# Patient Record
Sex: Female | Born: 1975 | Race: Black or African American | Hispanic: No | Marital: Single | Smoking: Current every day smoker
Health system: Southern US, Community
[De-identification: ages and names within clinical notes are randomized; demographics above are authoritative.]

## PROBLEM LIST (undated history)

## (undated) DIAGNOSIS — G629 Polyneuropathy, unspecified: Secondary | ICD-10-CM

---

## 2000-08-09 ENCOUNTER — Encounter: Admission: RE | Admit: 2000-08-09 | Discharge: 2000-08-09 | Payer: Self-pay | Admitting: Obstetrics

## 2000-08-16 ENCOUNTER — Encounter: Admission: RE | Admit: 2000-08-16 | Discharge: 2000-08-16 | Payer: Self-pay | Admitting: Obstetrics

## 2000-10-18 ENCOUNTER — Encounter: Admission: RE | Admit: 2000-10-18 | Discharge: 2000-10-18 | Payer: Self-pay | Admitting: Obstetrics

## 2000-10-18 ENCOUNTER — Ambulatory Visit (HOSPITAL_COMMUNITY): Admission: RE | Admit: 2000-10-18 | Discharge: 2000-10-18 | Payer: Self-pay | Admitting: Obstetrics

## 2000-11-01 ENCOUNTER — Encounter: Admission: RE | Admit: 2000-11-01 | Discharge: 2000-11-01 | Payer: Self-pay | Admitting: Obstetrics

## 2000-11-21 ENCOUNTER — Encounter: Admission: RE | Admit: 2000-11-21 | Discharge: 2000-11-21 | Payer: Self-pay | Admitting: Obstetrics & Gynecology

## 2000-11-28 ENCOUNTER — Encounter: Admission: RE | Admit: 2000-11-28 | Discharge: 2000-11-28 | Payer: Self-pay | Admitting: Obstetrics & Gynecology

## 2000-12-03 ENCOUNTER — Inpatient Hospital Stay (HOSPITAL_COMMUNITY): Admission: AD | Admit: 2000-12-03 | Discharge: 2000-12-05 | Payer: Self-pay | Admitting: Obstetrics & Gynecology

## 2008-09-02 ENCOUNTER — Ambulatory Visit (HOSPITAL_COMMUNITY): Admission: RE | Admit: 2008-09-02 | Discharge: 2008-09-02 | Payer: Self-pay | Admitting: Obstetrics and Gynecology

## 2008-10-01 ENCOUNTER — Ambulatory Visit (HOSPITAL_COMMUNITY): Admission: RE | Admit: 2008-10-01 | Discharge: 2008-10-01 | Payer: Self-pay | Admitting: Obstetrics and Gynecology

## 2008-10-29 ENCOUNTER — Ambulatory Visit (HOSPITAL_COMMUNITY): Admission: RE | Admit: 2008-10-29 | Discharge: 2008-10-29 | Payer: Self-pay | Admitting: Obstetrics and Gynecology

## 2008-11-26 ENCOUNTER — Encounter: Payer: Self-pay | Admitting: Obstetrics and Gynecology

## 2008-11-26 ENCOUNTER — Observation Stay (HOSPITAL_COMMUNITY): Admission: AD | Admit: 2008-11-26 | Discharge: 2008-11-27 | Payer: Self-pay | Admitting: Obstetrics & Gynecology

## 2008-12-18 ENCOUNTER — Encounter: Payer: Self-pay | Admitting: Obstetrics and Gynecology

## 2008-12-18 ENCOUNTER — Inpatient Hospital Stay (HOSPITAL_COMMUNITY): Admission: AD | Admit: 2008-12-18 | Discharge: 2008-12-18 | Payer: Self-pay | Admitting: Obstetrics and Gynecology

## 2008-12-25 ENCOUNTER — Ambulatory Visit (HOSPITAL_COMMUNITY): Admission: RE | Admit: 2008-12-25 | Discharge: 2008-12-25 | Payer: Self-pay | Admitting: Obstetrics and Gynecology

## 2009-01-08 ENCOUNTER — Encounter: Payer: Self-pay | Admitting: Obstetrics and Gynecology

## 2009-01-08 ENCOUNTER — Inpatient Hospital Stay (HOSPITAL_COMMUNITY): Admission: AD | Admit: 2009-01-08 | Discharge: 2009-01-10 | Payer: Self-pay | Admitting: Obstetrics and Gynecology

## 2009-01-09 ENCOUNTER — Encounter: Payer: Self-pay | Admitting: Obstetrics and Gynecology

## 2009-11-20 IMAGING — US US OB FOLLOW-UP
1 series · 18 of 28 positions shown · non-contrast
Comparison: none

OBSTETRICAL ULTRASOUND:
 This ultrasound was performed in The [HOSPITAL], and the [facility] report will be stored to [REDACTED] PACS.

[Series 1: us ob follow-up · 18 of 46 slices shown]
[im 1/46]
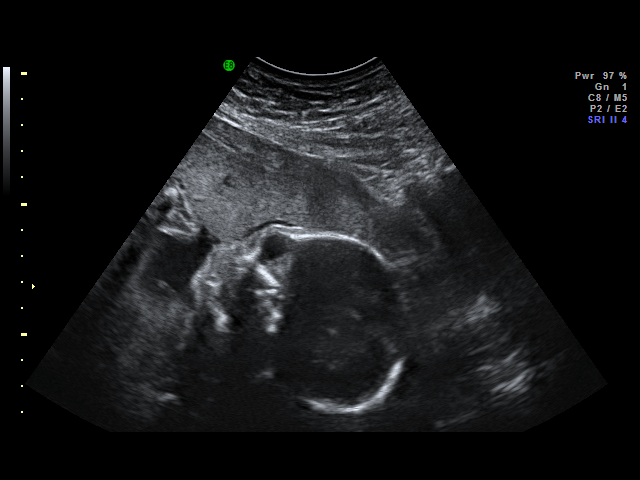
[im 4/46]
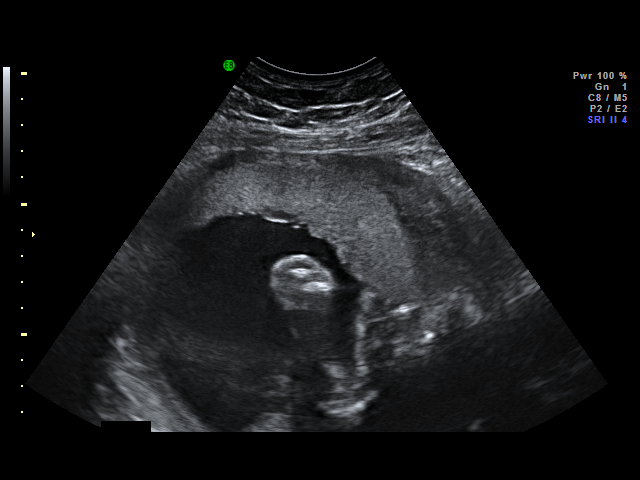
[im 6/46]
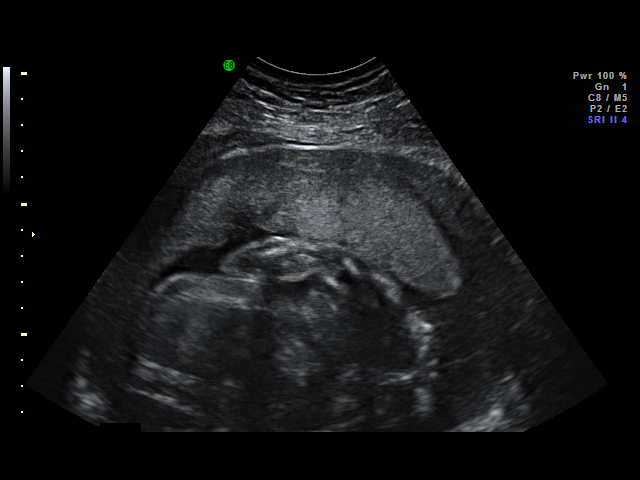
[im 9/46]
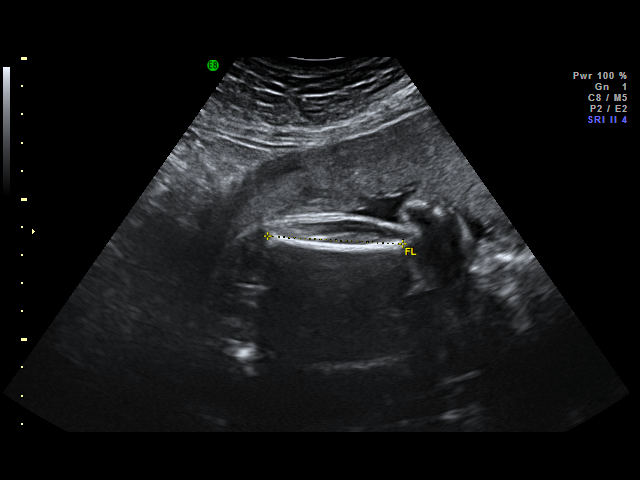
[im 12/46]
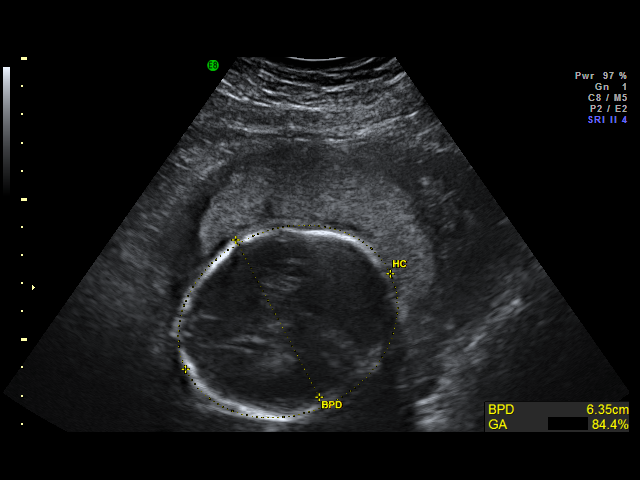
[im 14/46]
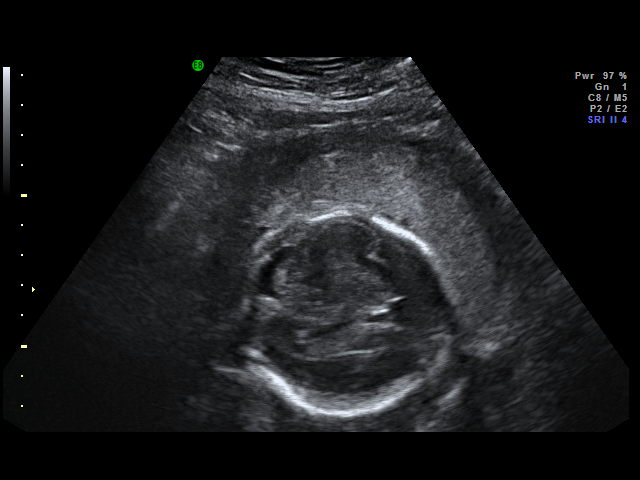
[im 17/46]
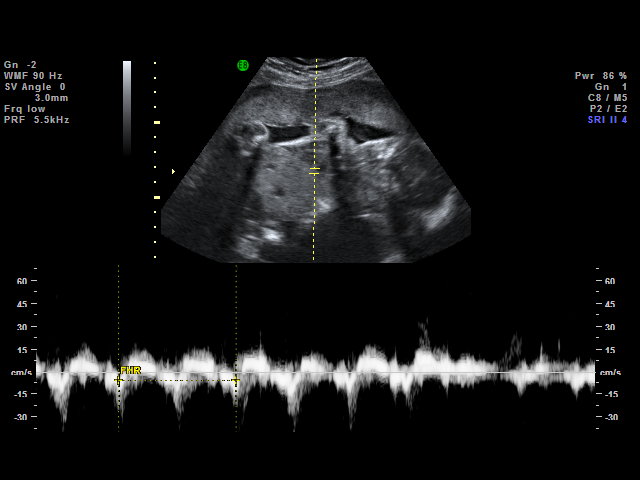
[im 19/46]
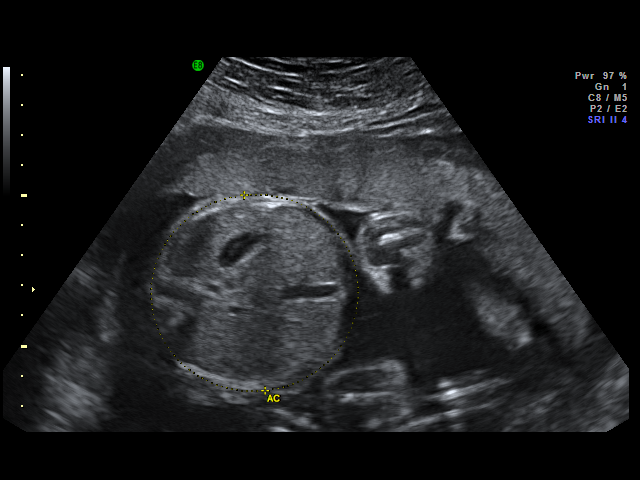
[im 22/46]
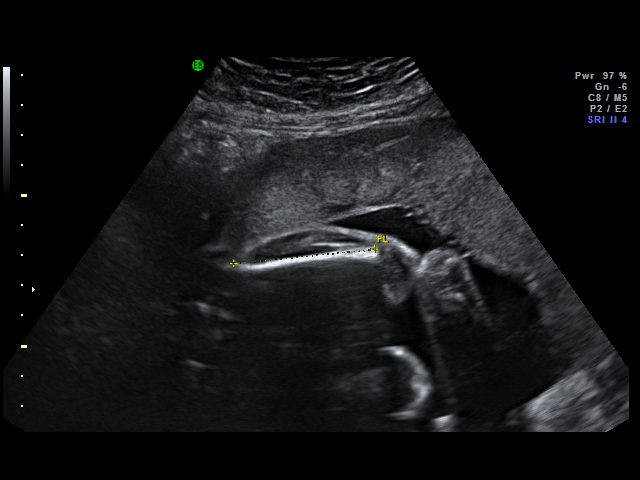
[im 24/46]
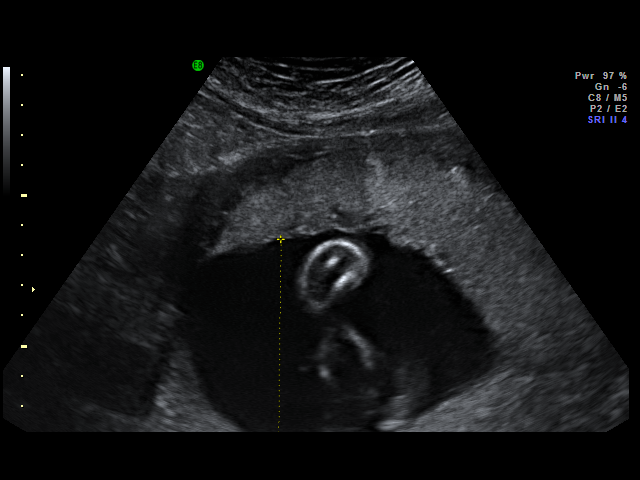
[im 27/46]
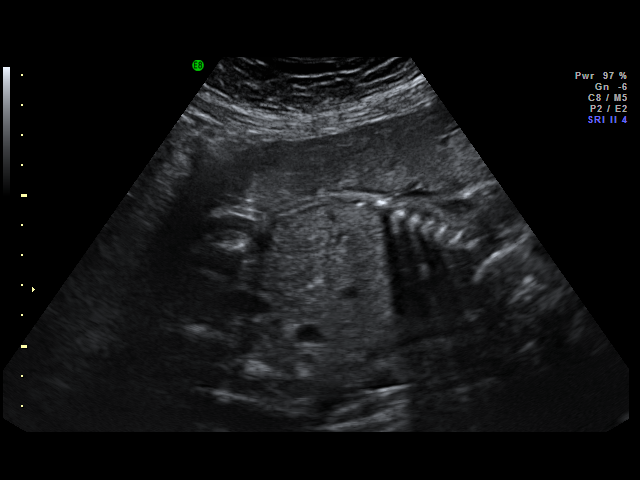
[im 29/46]
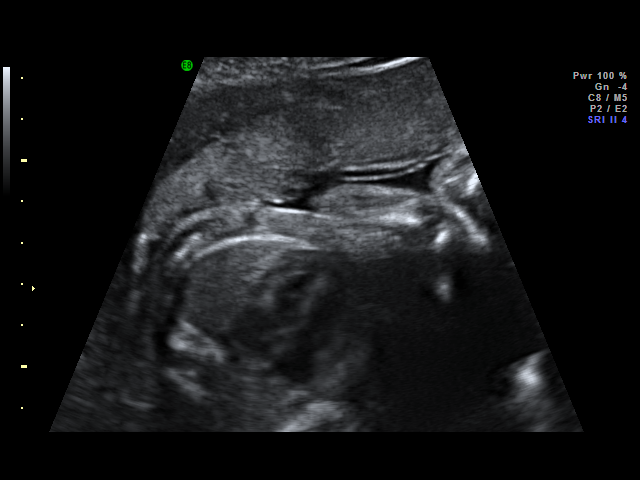
[im 32/46]
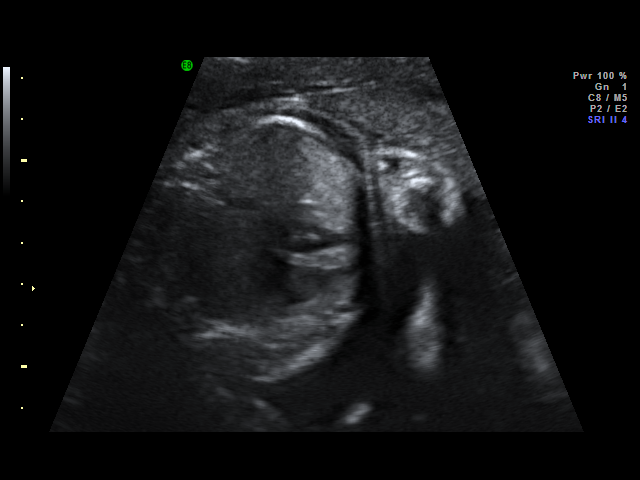
[im 36/46]
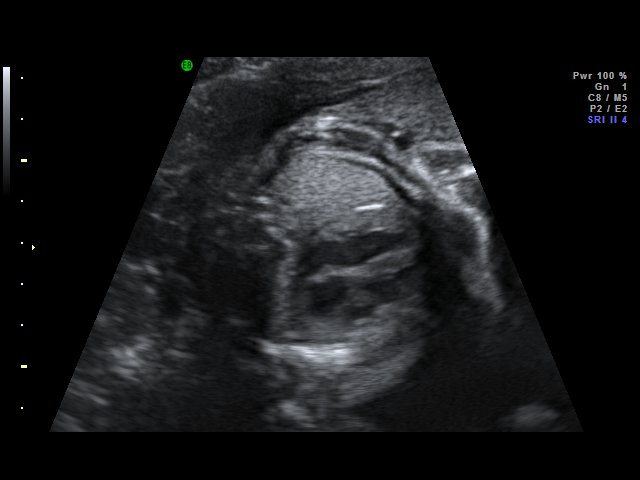
[im 37/46]
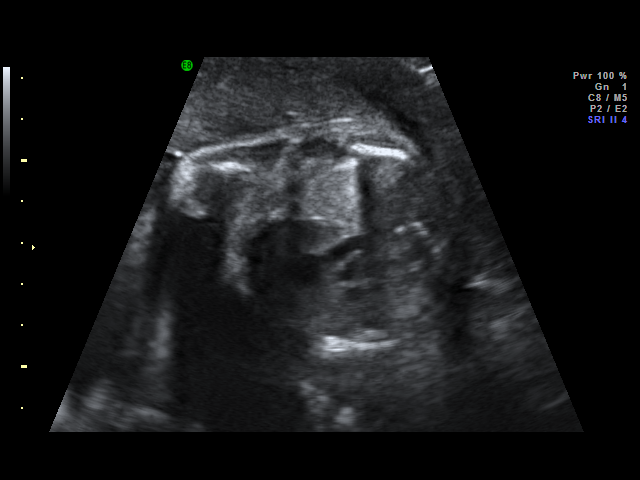
[im 41/46]
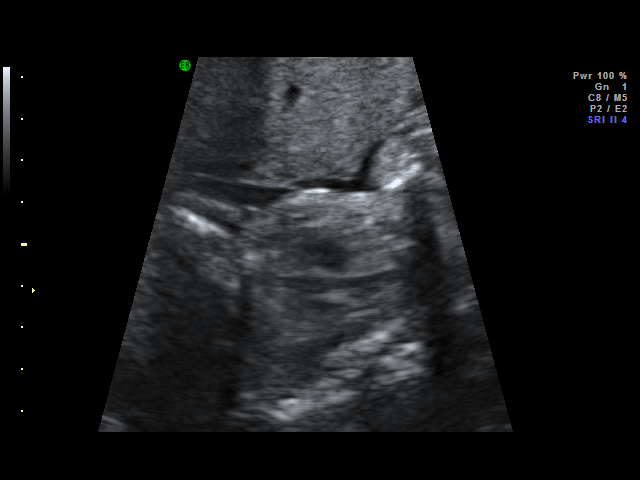
[im 42/46]
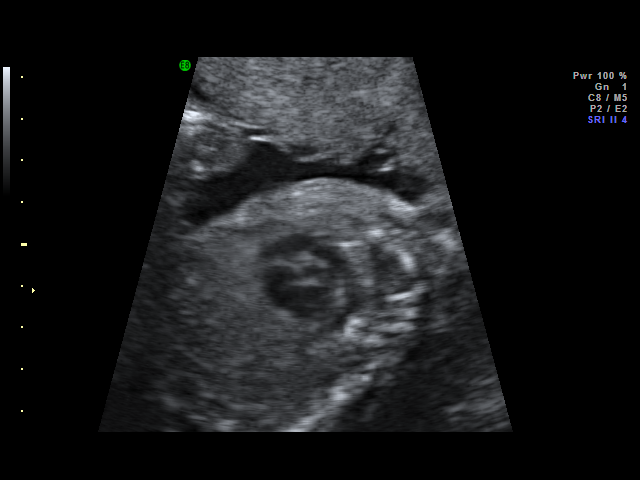
[im 46/46]
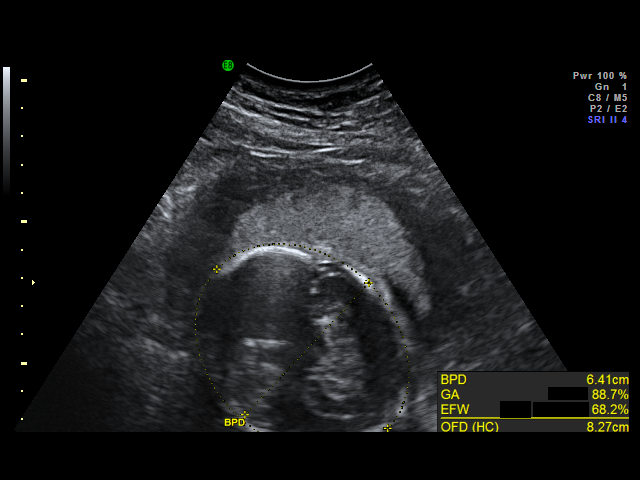

[18 of 28 positions shown; findings below may reference images not displayed]

IMPRESSION: [facility] has also been faxed to the ordering physician.

## 2010-02-06 IMAGING — US US OB FOLLOW-UP
1 series · 18 of 25 positions shown · non-contrast
Comparison: none

OBSTETRICAL ULTRASOUND:
 This ultrasound was performed in The [[facility]OSP[facility]], and the [facility] report will be stored to [REDACTED] PACS.

[Series 1: us ob follow-up · 18 of 25 slices shown]
[im 1/25]
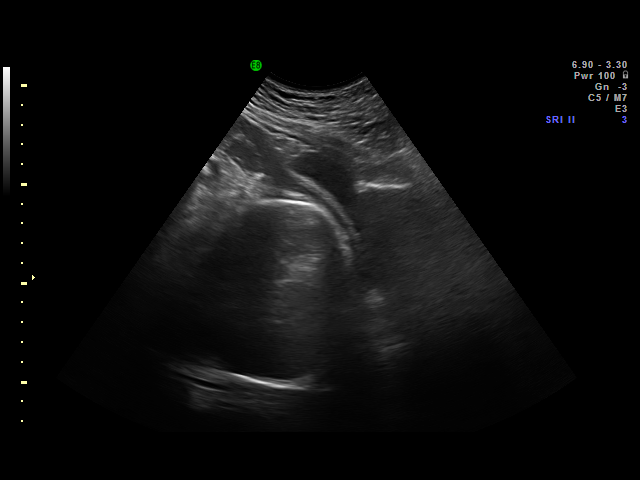
[im 3/25]
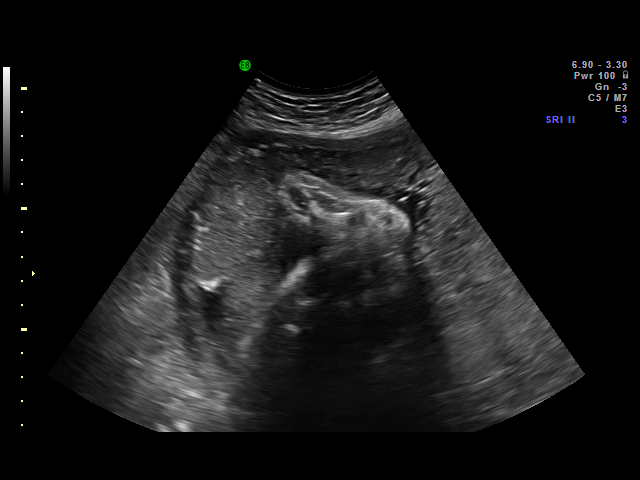
[im 4/25]
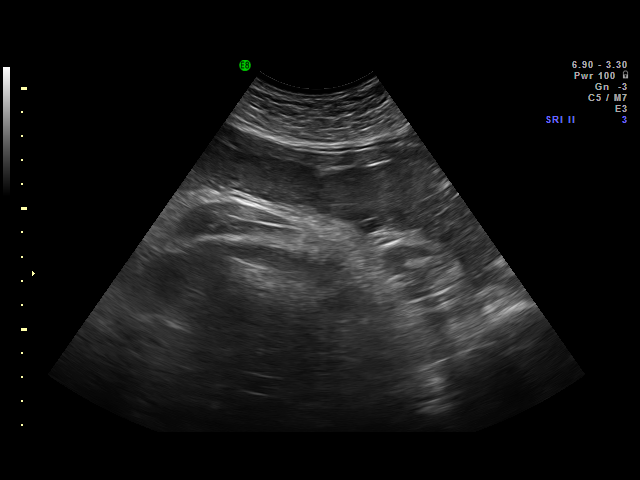
[im 5/25]
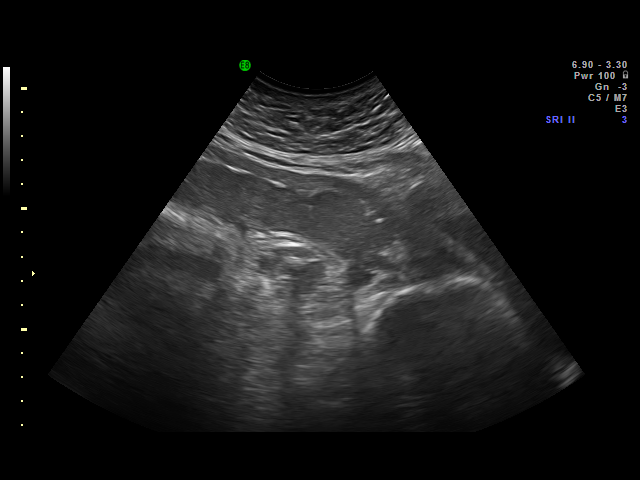
[im 7/25]
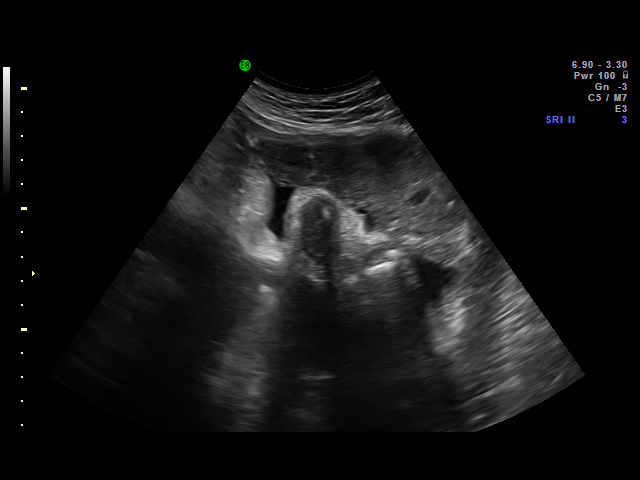
[im 8/25]
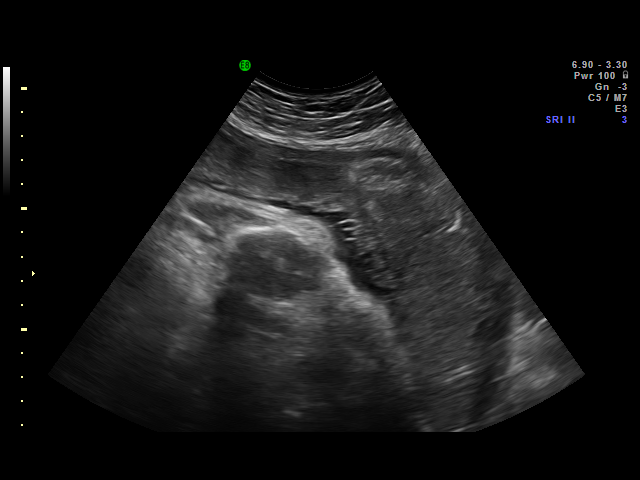
[im 10/25]
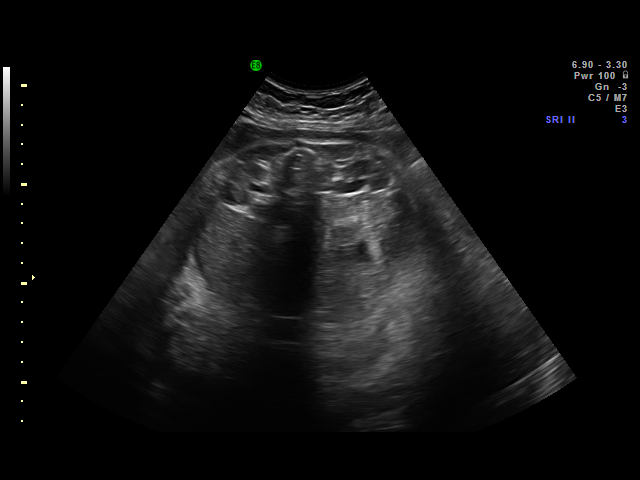
[im 11/25]
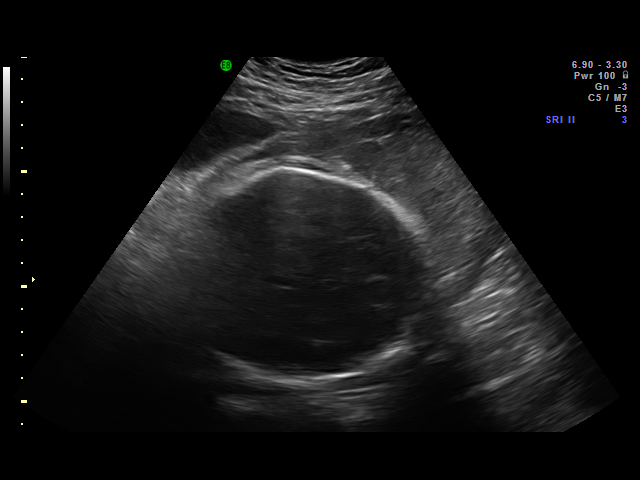
[im 12/25]
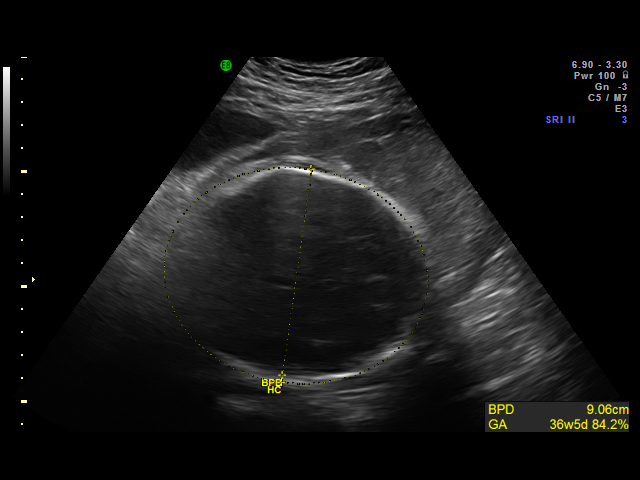
[im 14/25]
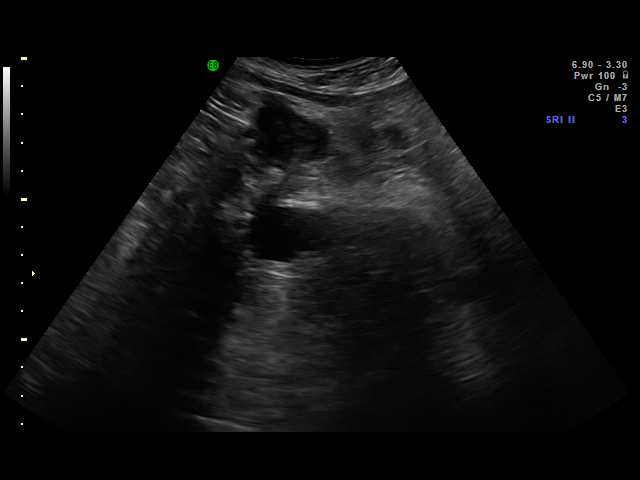
[im 15/25]
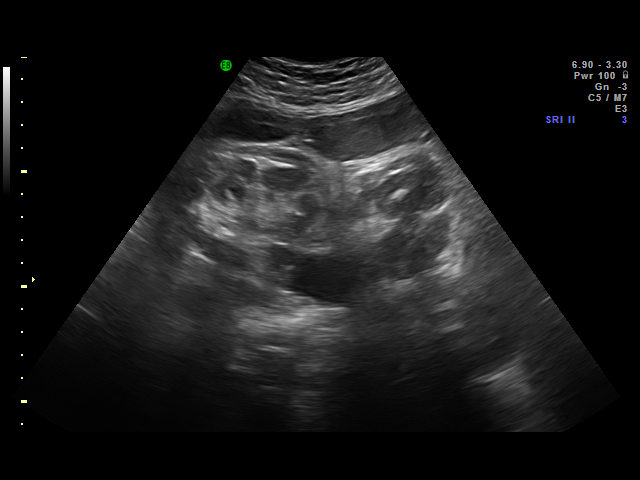
[im 16/25]
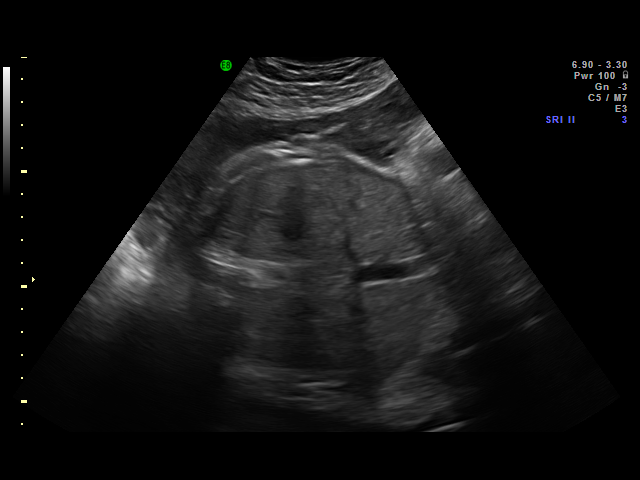
[im 18/25]
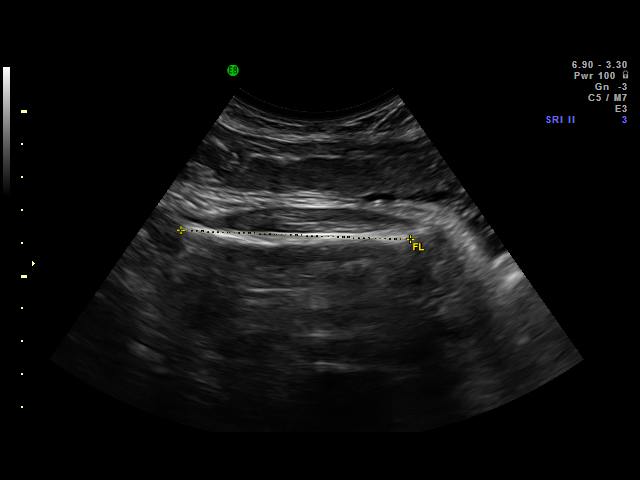
[im 19/25]
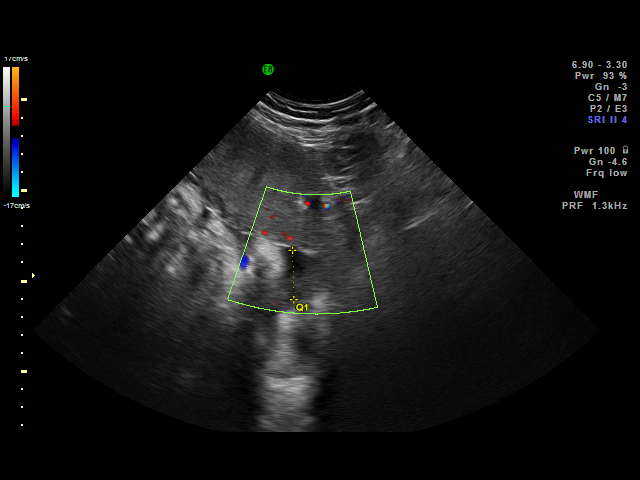
[im 21/25]
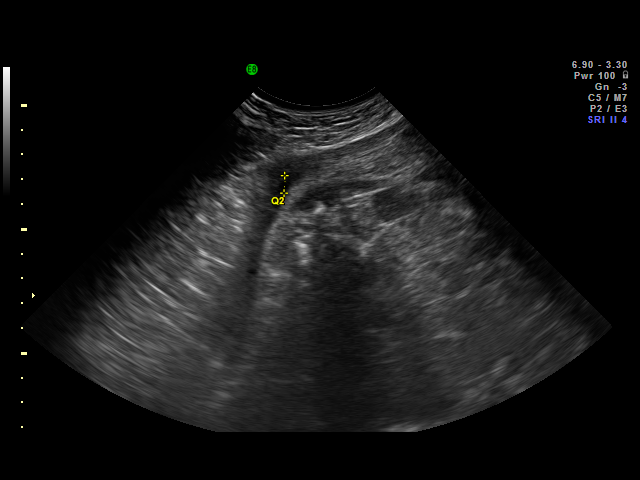
[im 22/25]
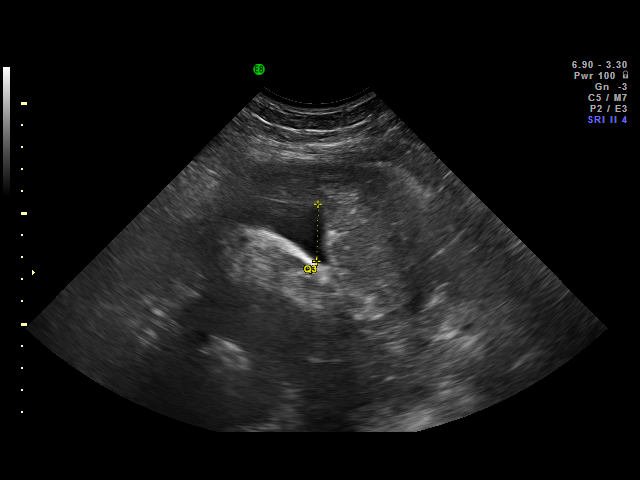
[im 23/25]
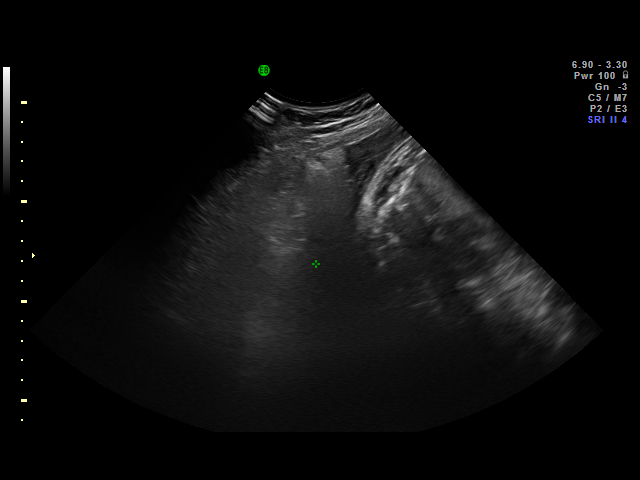
[im 25/25]
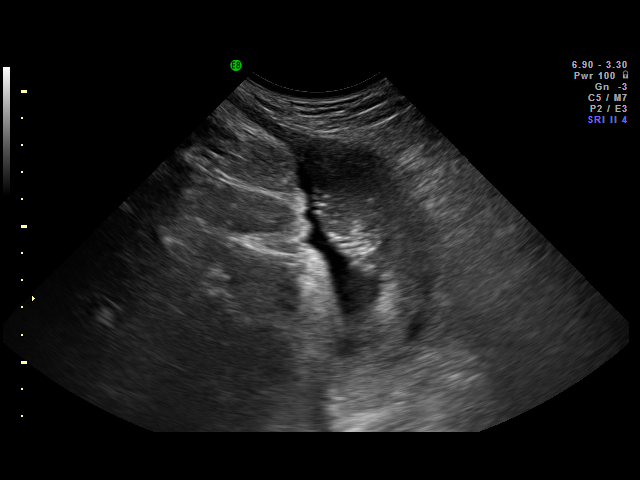

[18 of 25 positions shown; findings below may reference images not displayed]

IMPRESSION: [facility] has also been faxed to the ordering physician.

## 2010-02-13 IMAGING — US US OB LIMITED
1 series · 14 of 15 positions shown · non-contrast
Comparison: none

OBSTETRICAL ULTRASOUND:
 This ultrasound was performed in The [[facility]OS[facility]], and the [facility] report will be stored to [REDACTED] PACS.

[Series 1: us ob limited · 14 of 15 slices shown]
[im 1/15]
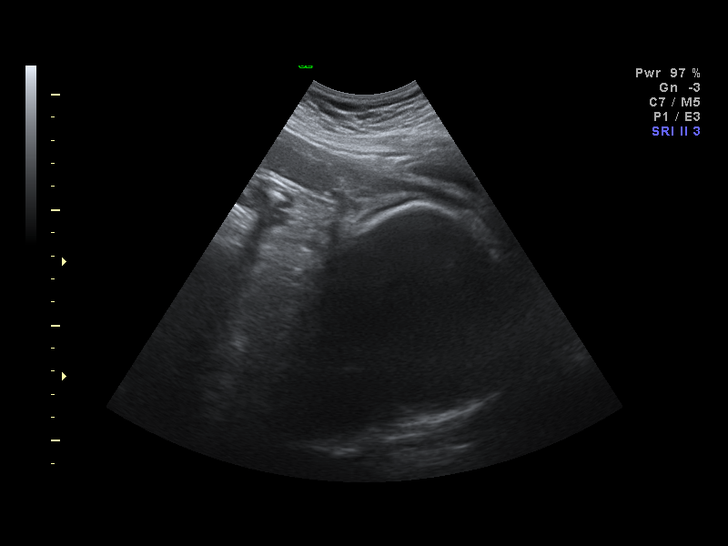
[im 2/15]
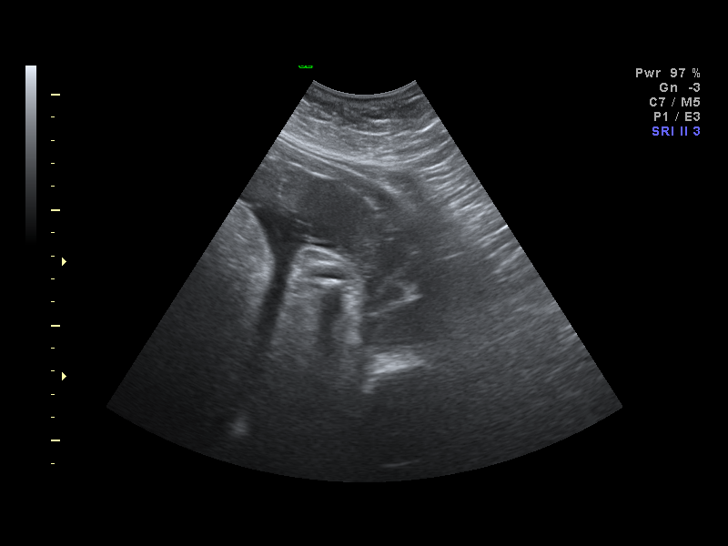
[im 3/15]
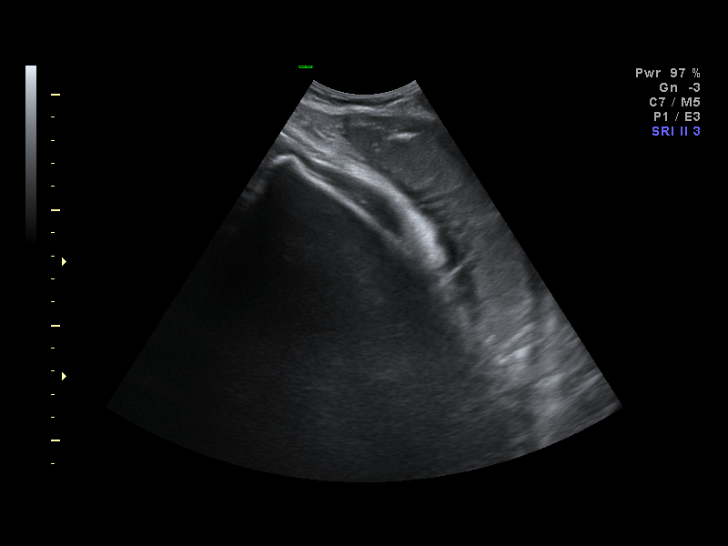
[im 4/15]
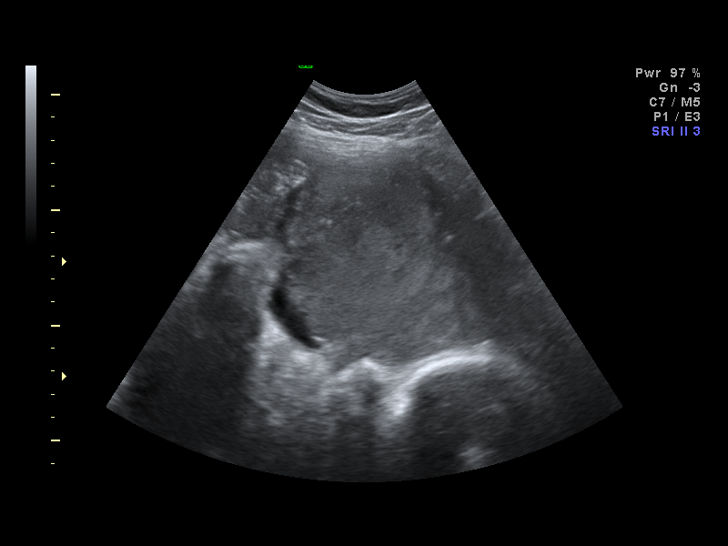
[im 5/15]
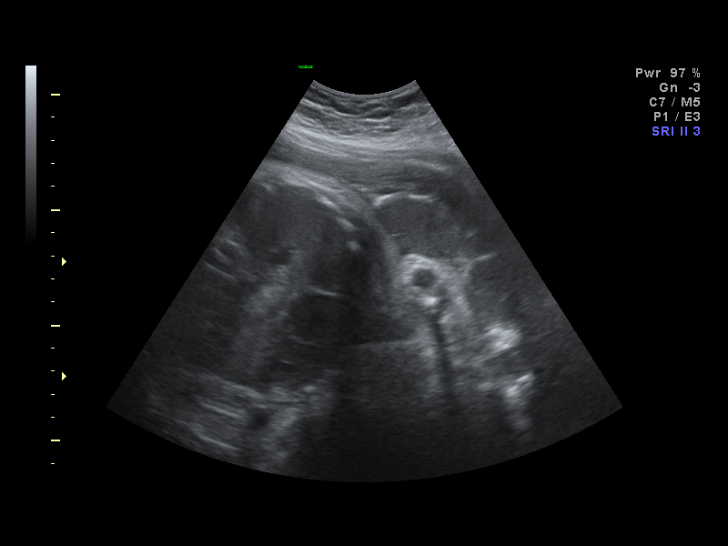
[im 6/15]
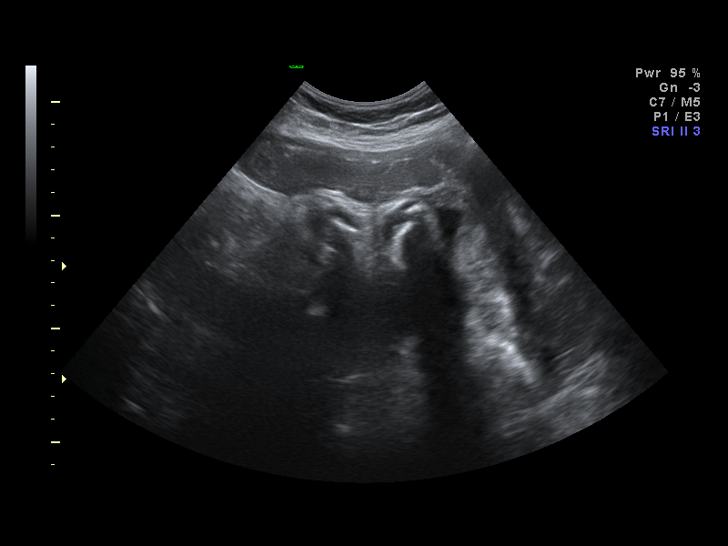
[im 7/15]
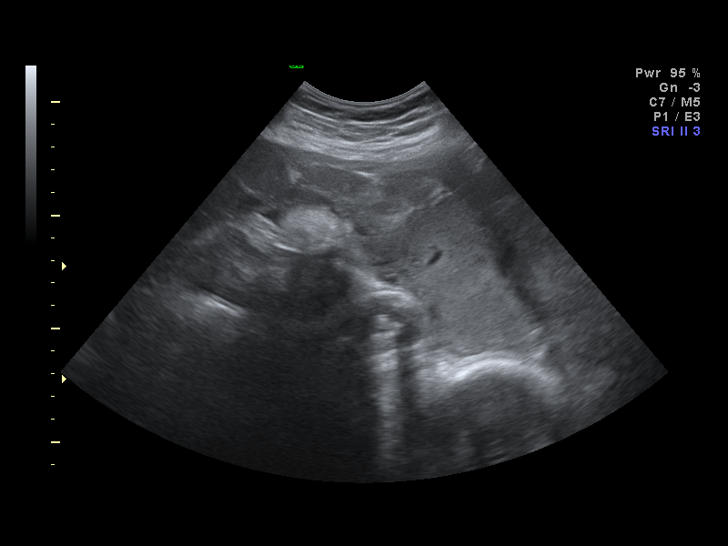
[im 9/15]
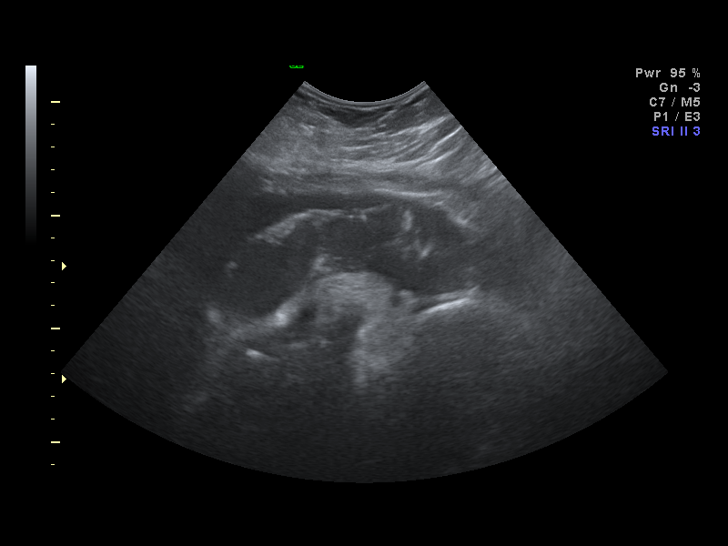
[im 10/15]
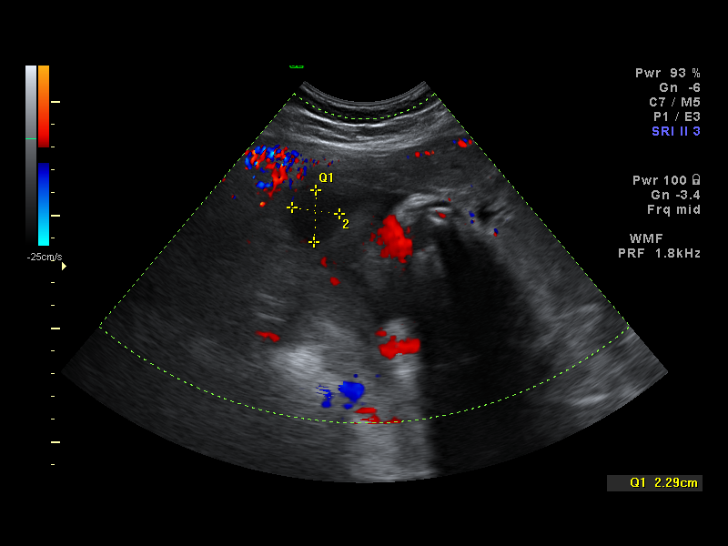
[im 11/15]
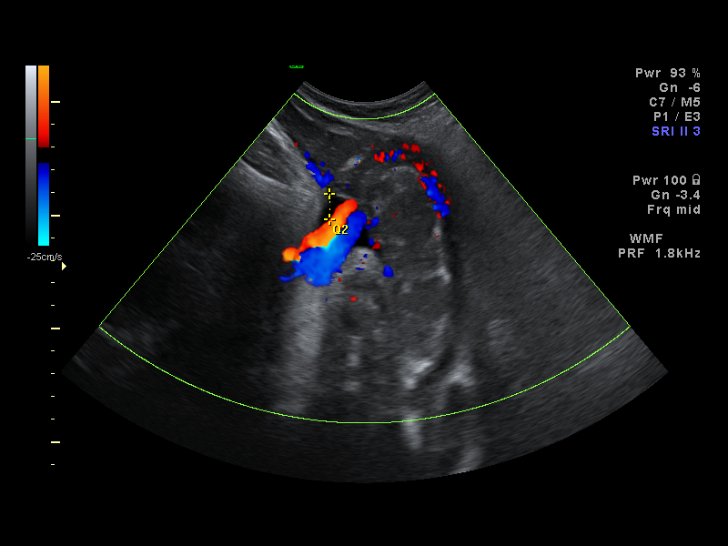
[im 12/15]
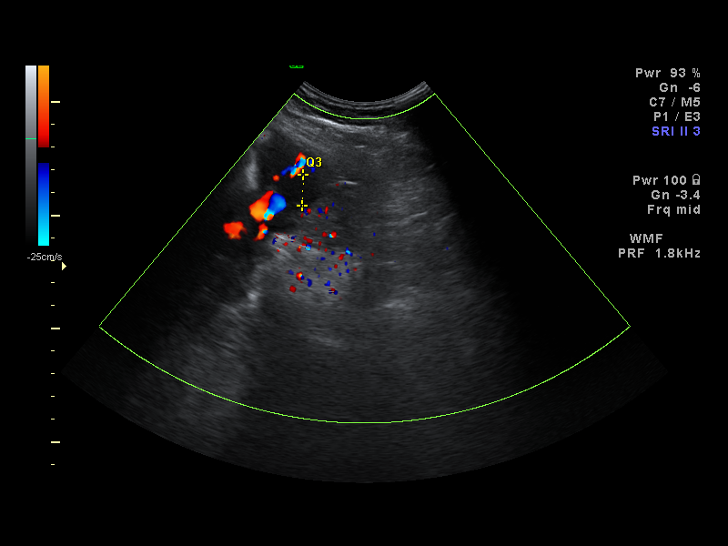
[im 13/15]
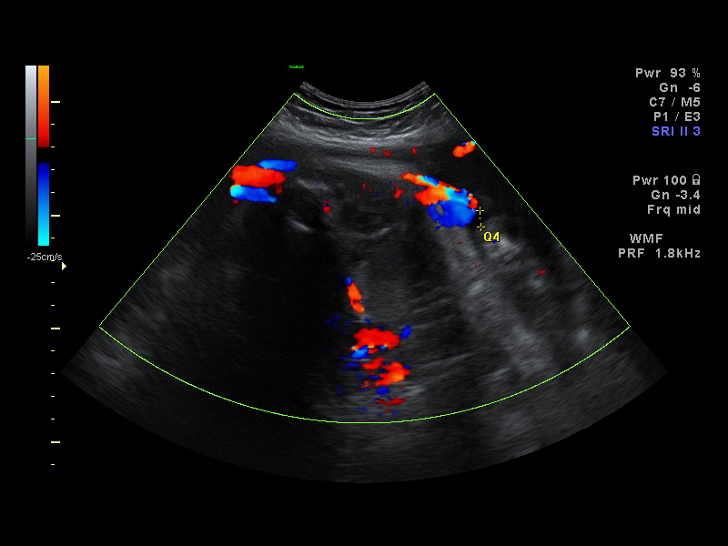
[im 14/15]
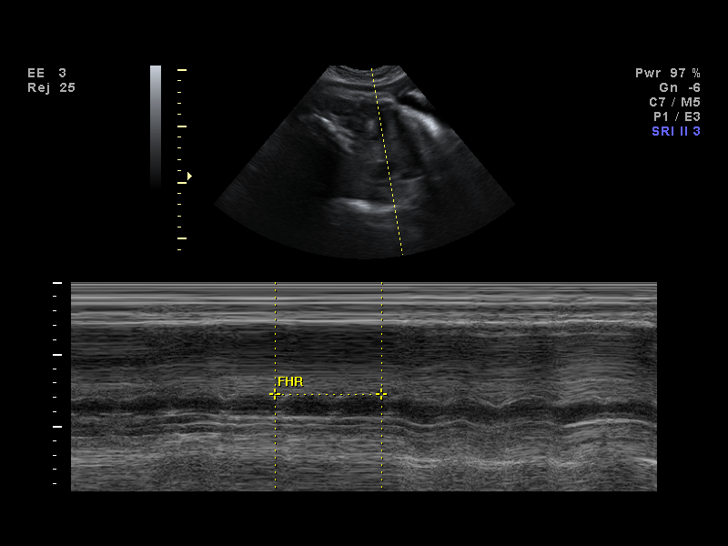
[im 15/15]
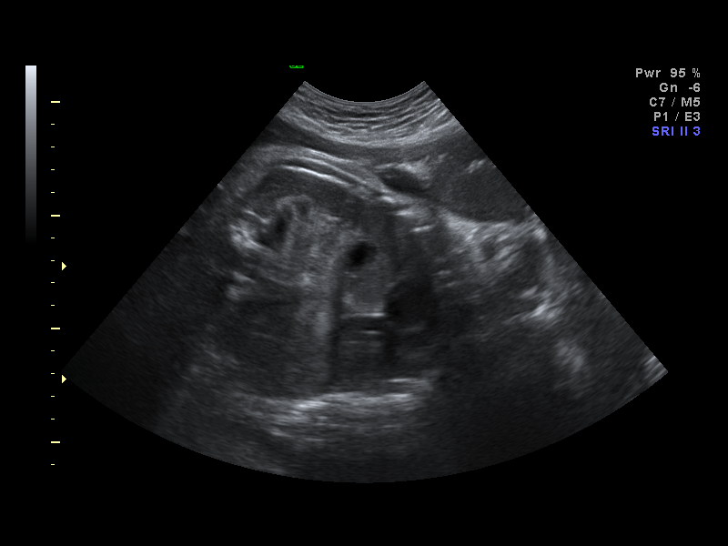

[14 of 15 positions shown; findings below may reference images not displayed]

IMPRESSION: [facility] has also been faxed to the ordering physician.

## 2010-05-16 ENCOUNTER — Encounter: Payer: Self-pay | Admitting: Obstetrics and Gynecology

## 2010-07-29 LAB — CBC
HCT: 26 % — ABNORMAL LOW (ref 36.0–46.0)
HCT: 35 % — ABNORMAL LOW (ref 36.0–46.0)
Hemoglobin: 11.6 g/dL — ABNORMAL LOW (ref 12.0–15.0)
Hemoglobin: 8.8 g/dL — ABNORMAL LOW (ref 12.0–15.0)
MCHC: 33 g/dL (ref 30.0–36.0)
MCHC: 33.4 g/dL (ref 30.0–36.0)
MCHC: 33.6 g/dL (ref 30.0–36.0)
MCV: 86.6 fL (ref 78.0–100.0)
Platelets: 235 10*3/uL (ref 150–400)
Platelets: 265 10*3/uL (ref 150–400)
RBC: 3.48 MIL/uL — ABNORMAL LOW (ref 3.87–5.11)
RBC: 4.04 MIL/uL (ref 3.87–5.11)
RDW: 15.5 % (ref 11.5–15.5)
WBC: 25 10*3/uL — ABNORMAL HIGH (ref 4.0–10.5)

## 2010-07-29 LAB — PROTIME-INR: INR: 0.9 (ref 0.00–1.49)

## 2010-07-29 LAB — APTT: aPTT: 27 seconds (ref 24–37)

## 2010-07-29 LAB — COMPREHENSIVE METABOLIC PANEL
AST: 18 U/L (ref 0–37)
BUN: 3 mg/dL — ABNORMAL LOW (ref 6–23)
CO2: 22 mEq/L (ref 19–32)
Chloride: 107 mEq/L (ref 96–112)
Creatinine, Ser: 0.47 mg/dL (ref 0.4–1.2)
GFR calc Af Amer: >60 mL/min (ref >60)
GFR calc non Af Amer: >60 mL/min (ref >60)
Glucose, Bld: 99 mg/dL (ref 70–99)
Total Bilirubin: 0.3 mg/dL (ref 0.3–1.2)

## 2010-07-29 LAB — LACTATE DEHYDROGENASE: LDH: 151 U/L (ref 94–250)

## 2010-07-29 LAB — URINALYSIS, DIPSTICK ONLY
Glucose, UA: NEGATIVE mg/dL
Nitrite: NEGATIVE
pH: 6 (ref 5.0–8.0)

## 2010-07-29 LAB — CREATININE, SERUM
Creatinine, Ser: 0.6 mg/dL (ref 0.4–1.2)
GFR calc Af Amer: >60 mL/min (ref >60)

## 2010-07-29 LAB — FIBRINOGEN: Fibrinogen: 548 mg/dL — ABNORMAL HIGH (ref 204–475)

## 2010-07-29 LAB — URIC ACID: Uric Acid, Serum: 4.6 mg/dL (ref 2.4–7.0)

## 2010-07-29 LAB — MAGNESIUM: Magnesium: 4.3 mg/dL — ABNORMAL HIGH (ref 1.5–2.5)

## 2010-07-29 LAB — ALT: ALT: 11 U/L (ref 0–35)

## 2010-07-30 LAB — PROTEIN, URINE, 24 HOUR
Collection Interval-UPROT: 24 hours
Protein, 24H Urine: 123 mg/d — ABNORMAL HIGH (ref 50–100)
Protein, Urine: 5 mg/dL
Urine Total Volume-UPROT: 2450 mL

## 2010-07-30 LAB — URINALYSIS, DIPSTICK ONLY
Leukocytes, UA: NEGATIVE
Nitrite: NEGATIVE
Nitrite: NEGATIVE
Specific Gravity, Urine: 1.01 (ref 1.005–1.030)
Specific Gravity, Urine: 1.02 (ref 1.005–1.030)
Urobilinogen, UA: 1 mg/dL (ref 0.0–1.0)
pH: 6.5 (ref 5.0–8.0)
pH: 7 (ref 5.0–8.0)

## 2010-07-30 LAB — DIC (DISSEMINATED INTRAVASCULAR COAGULATION) PANEL
Fibrinogen: 543 mg/dL — ABNORMAL HIGH (ref 204–475)
Platelets: 271 10*3/uL (ref 150–400)
Prothrombin Time: 13.2 seconds (ref 11.6–15.2)

## 2010-07-30 LAB — CBC
HCT: 34.2 % — ABNORMAL LOW (ref 36.0–46.0)
HCT: 35.4 % — ABNORMAL LOW (ref 36.0–46.0)
Hemoglobin: 11.3 g/dL — ABNORMAL LOW (ref 12.0–15.0)
Hemoglobin: 12 g/dL (ref 12.0–15.0)
MCHC: 33.2 g/dL (ref 30.0–36.0)
MCV: 87.4 fL (ref 78.0–100.0)
MCV: 88.6 fL (ref 78.0–100.0)
Platelets: 271 10*3/uL (ref 150–400)
RBC: 3.91 MIL/uL (ref 3.87–5.11)
RDW: 14.1 % (ref 11.5–15.5)
RDW: 13.8 % (ref 11.5–15.5)
WBC: 7.8 10*3/uL (ref 4.0–10.5)

## 2010-07-30 LAB — COMPREHENSIVE METABOLIC PANEL
ALT: 11 U/L (ref 0–35)
AST: 15 U/L (ref 0–37)
Albumin: 2.5 g/dL — ABNORMAL LOW (ref 3.5–5.2)
Alkaline Phosphatase: 195 U/L — ABNORMAL HIGH (ref 39–117)
Alkaline Phosphatase: 189 U/L — ABNORMAL HIGH (ref 39–117)
BUN: 4 mg/dL — ABNORMAL LOW (ref 6–23)
BUN: 1 mg/dL — ABNORMAL LOW (ref 6–23)
CO2: 23 mEq/L (ref 19–32)
Calcium: 8.5 mg/dL (ref 8.4–10.5)
Chloride: 108 mEq/L (ref 96–112)
Chloride: 108 mEq/L (ref 96–112)
Creatinine, Ser: 0.45 mg/dL (ref 0.4–1.2)
Creatinine, Ser: 0.39 mg/dL — ABNORMAL LOW (ref 0.4–1.2)
GFR calc Af Amer: >60 mL/min (ref >60)
GFR calc non Af Amer: >60 mL/min (ref >60)
Glucose, Bld: 77 mg/dL (ref 70–99)
Glucose, Bld: 106 mg/dL — ABNORMAL HIGH (ref 70–99)
Potassium: 3.9 mEq/L (ref 3.5–5.1)
Potassium: 3.5 mEq/L (ref 3.5–5.1)
Sodium: 135 mEq/L (ref 135–145)
Total Bilirubin: 0.3 mg/dL (ref 0.3–1.2)
Total Bilirubin: 0.1 mg/dL — ABNORMAL LOW (ref 0.3–1.2)
Total Protein: 6 g/dL (ref 6.0–8.3)
Total Protein: 5.8 g/dL — ABNORMAL LOW (ref 6.0–8.3)

## 2010-07-30 LAB — URINALYSIS, ROUTINE W REFLEX MICROSCOPIC
Bilirubin Urine: NEGATIVE
Glucose, UA: NEGATIVE mg/dL
Hgb urine dipstick: NEGATIVE
Ketones, ur: NEGATIVE mg/dL
Nitrite: NEGATIVE
Protein, ur: NEGATIVE mg/dL
Specific Gravity, Urine: 1.01 (ref 1.005–1.030)
Urobilinogen, UA: 1 mg/dL (ref 0.0–1.0)
pH: 7 (ref 5.0–8.0)

## 2010-07-30 LAB — CREATININE CLEARANCE, URINE, 24 HOUR
Collection Interval-CRCL: 24 hours
Creatinine Clearance: 261 mL/min — ABNORMAL HIGH (ref 75–115)
Creatinine, 24H Ur: 1467 mg/d (ref 700–1800)

## 2010-07-30 LAB — DIC (DISSEMINATED INTRAVASCULAR COAGULATION)PANEL
D-Dimer, Quant: 2.04 ug/mL-FEU — ABNORMAL HIGH (ref 0.00–0.48)
INR: 1 (ref 0.00–1.49)
Smear Review: NONE SEEN
aPTT: 26 seconds (ref 24–37)

## 2010-07-30 LAB — URINE MICROSCOPIC-ADD ON

## 2010-07-30 LAB — URIC ACID
Uric Acid, Serum: 3.6 mg/dL (ref 2.4–7.0)
Uric Acid, Serum: 3.8 mg/dL (ref 2.4–7.0)

## 2010-07-30 LAB — LACTATE DEHYDROGENASE: LDH: 125 U/L (ref 94–250)

## 2015-03-25 ENCOUNTER — Encounter: Payer: Self-pay | Admitting: Podiatry

## 2015-03-25 ENCOUNTER — Other Ambulatory Visit: Payer: Self-pay

## 2015-03-25 ENCOUNTER — Inpatient Hospital Stay: Admission: RE | Admit: 2015-03-25 | Payer: Self-pay | Source: Ambulatory Visit

## 2015-03-25 ENCOUNTER — Ambulatory Visit (INDEPENDENT_AMBULATORY_CARE_PROVIDER_SITE_OTHER): Payer: Medicaid Other | Admitting: Podiatry

## 2015-03-25 VITALS — BP 157/103 | HR 87 | Ht 64.0 in | Wt 194.0 lb

## 2015-03-25 DIAGNOSIS — M7740 Metatarsalgia, unspecified foot: Secondary | ICD-10-CM | POA: Diagnosis not present

## 2015-03-25 DIAGNOSIS — M216X9 Other acquired deformities of unspecified foot: Secondary | ICD-10-CM | POA: Diagnosis not present

## 2015-03-25 DIAGNOSIS — M21969 Unspecified acquired deformity of unspecified lower leg: Secondary | ICD-10-CM

## 2015-03-25 DIAGNOSIS — M7742 Metatarsalgia, left foot: Principal | ICD-10-CM

## 2015-03-25 DIAGNOSIS — M7741 Metatarsalgia, right foot: Secondary | ICD-10-CM

## 2015-03-25 DIAGNOSIS — G609 Hereditary and idiopathic neuropathy, unspecified: Secondary | ICD-10-CM

## 2015-03-25 DIAGNOSIS — G629 Polyneuropathy, unspecified: Secondary | ICD-10-CM | POA: Insufficient documentation

## 2015-03-25 MED ORDER — HYDROCODONE-ACETAMINOPHEN 5-325 MG PO TABS: 1.0000 | ORAL_TABLET | Freq: Two times a day (BID) | ORAL | Status: DC | PRN

## 2015-03-25 NOTE — Patient Instructions (Signed)
Seen for painful feet. Noted of abnormal bone structure that could lead poor balance during weight bearing. Symptoms consistent with Peripheral Neuropathy. May continue with Gabapentin and take pain pill if needed no more than twice a day.  Need to increase Dairy product in diet and have Bone density test done to conform Osteoporosis.  Return as needed.

## 2015-03-25 NOTE — Progress Notes (Signed)
SUBJECTIVE: 39 y.o. year old female presents with bilateral foot pain, all over with tingling, burning, foot pain goes up to leg. Feet and finger tips tightens up for the past 3 months. Lyrica did not help but Hydrocodone helps little bit better.  Diagnosed as pre diabetic. Not on feet much, not employed. When on feet, feels like standing on bones.   REVIEW OF SYSTEMS: Constitutional: negative Eyes: negative Ears, nose, mouth, throat, and face: negative Respiratory: negative Cardiovascular: negative Gastrointestinal: negative Genitourinary:negative Integument/breast: negative Musculoskeletal:Aching joints in lower limbs at times. Neurological: Tingling, burning, and numbness on both lower limbs. Behavioral/Psych: negative Endocrine: Diagnosed as pre diabetic. Allergic/Immunologic: Some sinus problems.  OBJECTIVE: DERMATOLOGIC EXAMINATION: Nails: normal appearing nails bilaterally Skin Integrity: No abnormal findings.   VASCULAR EXAMINATION OF LOWER LIMBS: Positive of mild forefoot edema along the lesser MPJ bilateral. Pedal pulses: All pedal pulses are palpable with normal pulsation.   NEUROLOGIC EXAMINATION OF THE LOWER LIMBS: Achilles DTR is present and within normal. Monofilament (Semmes-Weinstein 10-gm) sensory testing positive 6 out of 6, bilateral. Vibratory sensations(128Hz  turning fork) intact at medial and lateral forefoot bilateral.  Sharp and Dull discriminatory sensations at the plantar ball of hallux is intact bilateral.   MUSCULOSKELETAL EXAMINATION: Short first digit bilateral. Elevated first metatarsal and forefoot varus with loading of forefoot.  Normal forefoot and rearfoot joint motion bilateral.  RADIOGRAPHIC FINDINGS: General overview: Positive of mild Osteopenia Absence of soft tissue swelling. Absence of acute osseous or articular surfaces of forefoot or rearfoot.  AP View:  Short first Metatarsal with Fibular sesamoid at 4. Increased lateral  deviation of the Calcaneocuboid angle.  Lateral view:  Elevated first ray with normal CYMA ling bilateral.   ASSESSMENT: 1. 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 3. Osteoporosis. 4. Metatarsalgia bilateral.   PLAN: 1. Reviewed clinical findings and available treatment options. 2. May benefit from increasing Dairy product in diet and have Bone density test done. 3. May benefit from Orthotic shoe inserts. 4. Continue with Gabapentin and take pain pill as needed.

## 2015-04-21 ENCOUNTER — Ambulatory Visit (INDEPENDENT_AMBULATORY_CARE_PROVIDER_SITE_OTHER): Payer: Medicaid Other | Admitting: Podiatry

## 2015-04-21 ENCOUNTER — Encounter: Payer: Self-pay | Admitting: Podiatry

## 2015-04-21 DIAGNOSIS — M216X9 Other acquired deformities of unspecified foot: Secondary | ICD-10-CM | POA: Diagnosis not present

## 2015-04-21 DIAGNOSIS — M7741 Metatarsalgia, right foot: Secondary | ICD-10-CM

## 2015-04-21 DIAGNOSIS — M7742 Metatarsalgia, left foot: Principal | ICD-10-CM

## 2015-04-21 DIAGNOSIS — G609 Hereditary and idiopathic neuropathy, unspecified: Secondary | ICD-10-CM | POA: Diagnosis not present

## 2015-04-21 MED ORDER — NABUMETONE 500 MG PO TABS: 500.0000 mg | ORAL_TABLET | Freq: Two times a day (BID) | ORAL | Status: AC

## 2015-04-21 MED ORDER — HYDROCODONE-ACETAMINOPHEN 5-325 MG PO TABS: 1.0000 | ORAL_TABLET | Freq: Two times a day (BID) | ORAL | Status: DC | PRN

## 2015-04-21 NOTE — Patient Instructions (Signed)
Prescription added with Relafen to reduce narcotics. Return as needed.

## 2015-04-21 NOTE — Progress Notes (Signed)
SUBJECTIVE: 39 y.o. year old female presents with bilateral foot pain. Patient complained that the pain is tingling, burning, on feet that goes up to leg. F eet and finger tips tightens up for the past 3 months. Lyrica did not help but Hydrocodone helps little bit better.  Diagnosed as pre diabetic. Not on feet much, not employed. When on feet, feels like standing on bones.  Still not able to afford orthotics at this time. Wants medication refilled.  OBJECTIVE: DERMATOLOGIC EXAMINATION: Nails: normal appearing nails bilaterally Skin Integrity: No abnormal findings.  VASCULAR EXAMINATION OF LOWER LIMBS: Positive of mild forefoot edema along the lesser MPJ bilateral. Pedal pulses: All pedal pulses are palpable with normal pulsation.  NEUROLOGIC EXAMINATION OF THE LOWER LIMBS: Achilles DTR is present and within normal. Monofilament (Semmes-Weinstein 10-gm) sensory testing positive 6 out of 6, bilateral. Vibratory sensations(128Hz  turning fork) intact at medial and lateral forefoot bilateral.  Sharp and Dull discriminatory sensations at the plantar ball of hallux is intact bilateral.  MUSCULOSKELETAL EXAMINATION: Short first digit bilateral. Elevated first metatarsal and forefoot varus with loading of forefoot.  Normal forefoot and rearfoot joint motion bilateral.  ASSESSMENT: 1. 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 3. Osteoporosis. 4. Metatarsalgia bilateral.   PLAN: 1. Reviewed clinical findings and available treatment options. 2. May benefit from Orthotic shoe inserts. 3. Continue with Gabapentin and take pain pill as needed. 4. May try Relafen before taking narcotics... Discussed.

## 2015-04-22 ENCOUNTER — Other Ambulatory Visit: Payer: Self-pay | Admitting: Podiatry

## 2015-04-22 MED ORDER — NAPROXEN 500 MG PO TABS: 500.0000 mg | ORAL_TABLET | Freq: Two times a day (BID) | ORAL | Status: AC

## 2015-04-23 ENCOUNTER — Telehealth: Payer: Self-pay | Admitting: *Deleted

## 2015-04-23 NOTE — Telephone Encounter (Signed)
04/23/2015 Called patient on cell phone and told her her Insurance wouldn't cover the Nabumetone Prescription Dr. Raynald KempSheard gave her the other day and Dr. Raynald KempSheard sent another RX in for Naprosin.Thank you she said.

## 2015-05-17 ENCOUNTER — Telehealth: Payer: Self-pay | Admitting: *Deleted

## 2015-05-17 NOTE — Telephone Encounter (Signed)
Patient requests refill of Vicodin

## 2015-05-18 MED ORDER — HYDROCODONE-ACETAMINOPHEN 5-325 MG PO TABS: 1.0000 | ORAL_TABLET | Freq: Two times a day (BID) | ORAL | Status: DC | PRN

## 2015-06-15 ENCOUNTER — Ambulatory Visit (INDEPENDENT_AMBULATORY_CARE_PROVIDER_SITE_OTHER): Payer: Medicaid Other | Admitting: Podiatry

## 2015-06-15 ENCOUNTER — Encounter: Payer: Self-pay | Admitting: Podiatry

## 2015-06-15 ENCOUNTER — Ambulatory Visit: Payer: Medicaid Other | Admitting: Podiatry

## 2015-06-15 VITALS — BP 140/97 | HR 75

## 2015-06-15 DIAGNOSIS — G609 Hereditary and idiopathic neuropathy, unspecified: Secondary | ICD-10-CM | POA: Diagnosis not present

## 2015-06-15 DIAGNOSIS — M7741 Metatarsalgia, right foot: Secondary | ICD-10-CM

## 2015-06-15 DIAGNOSIS — M216X9 Other acquired deformities of unspecified foot: Secondary | ICD-10-CM | POA: Diagnosis not present

## 2015-06-15 DIAGNOSIS — M7740 Metatarsalgia, unspecified foot: Secondary | ICD-10-CM | POA: Diagnosis not present

## 2015-06-15 DIAGNOSIS — M21969 Unspecified acquired deformity of unspecified lower leg: Secondary | ICD-10-CM | POA: Diagnosis not present

## 2015-06-15 DIAGNOSIS — M7742 Metatarsalgia, left foot: Principal | ICD-10-CM

## 2015-06-15 MED ORDER — HYDROCODONE-ACETAMINOPHEN 10-325 MG PO TABS: 1.0000 | ORAL_TABLET | Freq: Three times a day (TID) | ORAL | Status: DC | PRN

## 2015-06-15 NOTE — Patient Instructions (Signed)
Seen for pain in both feet.  Still need to get Orthotics. Pain medication ordered.

## 2015-06-15 NOTE — Progress Notes (Signed)
SUBJECTIVE: 39 y.o. year old female presents with bilateral foot pain. Patient complained that the pain is tingling, burning, on feet that goes up to leg. F eet and finger tips tightens up for the past 3 months. Lyrica did not help but Hydrocodone helps little bit better.  Diagnosed as pre diabetic. Not on feet much, not employed. When on feet, feels like standing on bones.  Still not able to afford orthotics at this time. Wants medication refilled.  OBJECTIVE: DERMATOLOGIC EXAMINATION: Nails: normal appearing nails bilaterally Skin Integrity: No abnormal findings.  VASCULAR EXAMINATION OF LOWER LIMBS: Positive of mild forefoot edema along the lesser MPJ bilateral. Pedal pulses: All pedal pulses are palpable with normal pulsation.  NEUROLOGIC EXAMINATION OF THE LOWER LIMBS: Achilles DTR is present and within normal. Monofilament (Semmes-Weinstein 10-gm) sensory testing positive 6 out of 6, bilateral. Vibratory sensations(128Hz turning fork) intact at medial and lateral forefoot bilateral.  Sharp and Dull discriminatory sensations at the plantar ball of hallux is intact bilateral.  MUSCULOSKELETAL EXAMINATION: Short first digit bilateral. Elevated first metatarsal and forefoot varus with loading of forefoot.  Normal forefoot and rearfoot joint motion bilateral.  ASSESSMENT: 1. 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 3. Osteoporosis. 4. Metatarsalgia bilateral.   PLAN: 1. Reviewed clinical findings and available treatment options. 2. May benefit from Orthotic shoe inserts. 3. Continue with Gabapentin and take pain pill as needed. 4. May try Relafen before taking narcotics... Discussed.  

## 2015-07-13 ENCOUNTER — Ambulatory Visit (INDEPENDENT_AMBULATORY_CARE_PROVIDER_SITE_OTHER): Payer: Medicaid Other | Admitting: Podiatry

## 2015-07-13 ENCOUNTER — Encounter: Payer: Self-pay | Admitting: Podiatry

## 2015-07-13 VITALS — BP 142/102 | HR 78

## 2015-07-13 DIAGNOSIS — M7740 Metatarsalgia, unspecified foot: Secondary | ICD-10-CM | POA: Diagnosis not present

## 2015-07-13 DIAGNOSIS — M216X9 Other acquired deformities of unspecified foot: Secondary | ICD-10-CM | POA: Diagnosis not present

## 2015-07-13 DIAGNOSIS — M21969 Unspecified acquired deformity of unspecified lower leg: Secondary | ICD-10-CM

## 2015-07-13 DIAGNOSIS — G609 Hereditary and idiopathic neuropathy, unspecified: Secondary | ICD-10-CM | POA: Diagnosis not present

## 2015-07-13 DIAGNOSIS — M7742 Metatarsalgia, left foot: Secondary | ICD-10-CM

## 2015-07-13 DIAGNOSIS — M7741 Metatarsalgia, right foot: Secondary | ICD-10-CM

## 2015-07-13 MED ORDER — HYDROCODONE-ACETAMINOPHEN 10-325 MG PO TABS: 1.0000 | ORAL_TABLET | Freq: Three times a day (TID) | ORAL | Status: DC | PRN

## 2015-07-13 NOTE — Progress Notes (Signed)
Pain in both feet, needs pain medication. Stated that pain is all over, lower limb, top and bottom of both feet with tingling toes. Duration of pain is about 6-7 months. Still on Neurontin but Hydrocodone helps better.  Not employed.   OBJECTIVE: DERMATOLOGIC EXAMINATION: Nails: normal appearing nails bilaterally Skin Integrity: No abnormal findings.  VASCULAR EXAMINATION OF LOWER LIMBS: Positive of mild forefoot edema along the lesser MPJ bilateral. Pedal pulses: All pedal pulses are palpable with normal pulsation.  NEUROLOGIC EXAMINATION OF THE LOWER LIMBS: Achilles DTR is present and within normal. Monofilament (Semmes-Weinstein 10-gm) sensory testing positive 6 out of 6, bilateral. Vibratory sensations(128Hz  turning fork) intact at medial and lateral forefoot bilateral.  Sharp and Dull discriminatory sensations at the plantar ball of hallux is intact bilateral.  MUSCULOSKELETAL EXAMINATION: Short first digit bilateral. Elevated first metatarsal and forefoot varus with loading of forefoot.  Normal forefoot and rearfoot joint motion bilateral.  ASSESSMENT: 1. 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 3. Osteoporosis. 4. Metatarsalgia bilateral.   Reviewed possible surgical option to improve condition and reduce pain medication. May need Cotton osteotomy with bone graft.  Also reviewed proper shoe gear and benefit of orthotics, OTC or custom.

## 2015-07-13 NOTE — Patient Instructions (Signed)
Seen for pain in both feet. Pain medication re ordered. Need to wear well supported tennis shoes with Orthotics, custom/ or OTC. Surgical options reviewed.

## 2015-08-10 ENCOUNTER — Telehealth: Payer: Self-pay | Admitting: *Deleted

## 2015-08-10 MED ORDER — HYDROCODONE-ACETAMINOPHEN 10-325 MG PO TABS: 1.0000 | ORAL_TABLET | Freq: Three times a day (TID) | ORAL | Status: DC | PRN

## 2015-08-10 NOTE — Telephone Encounter (Signed)
08/10/2015 Patient called this afternoon and ask could she get a refill on her pain rx.

## 2015-08-31 ENCOUNTER — Encounter: Payer: Self-pay | Admitting: Podiatry

## 2015-08-31 ENCOUNTER — Ambulatory Visit (INDEPENDENT_AMBULATORY_CARE_PROVIDER_SITE_OTHER): Payer: Medicaid Other | Admitting: Podiatry

## 2015-08-31 VITALS — BP 147/100 | HR 72

## 2015-08-31 DIAGNOSIS — M216X9 Other acquired deformities of unspecified foot: Secondary | ICD-10-CM

## 2015-08-31 DIAGNOSIS — M7740 Metatarsalgia, unspecified foot: Secondary | ICD-10-CM

## 2015-08-31 DIAGNOSIS — G609 Hereditary and idiopathic neuropathy, unspecified: Secondary | ICD-10-CM | POA: Diagnosis not present

## 2015-08-31 DIAGNOSIS — M21969 Unspecified acquired deformity of unspecified lower leg: Secondary | ICD-10-CM

## 2015-08-31 DIAGNOSIS — M7741 Metatarsalgia, right foot: Secondary | ICD-10-CM

## 2015-08-31 DIAGNOSIS — M7742 Metatarsalgia, left foot: Secondary | ICD-10-CM

## 2015-08-31 MED ORDER — HYDROCODONE-ACETAMINOPHEN 10-325 MG PO TABS: 1.0000 | ORAL_TABLET | Freq: Three times a day (TID) | ORAL | Status: AC | PRN

## 2015-08-31 NOTE — Progress Notes (Signed)
Subjective: 40 year old female presents accompanied by her mother for pain in both feet. Lindley's mother requested information on surgical options. Still taking Lyrica for nerve pain and request for pain medication..   Patient described stating the pain is all over, lower limb, top and bottom of both feet with tingling toes.  This pain started about Summer of 2016.  Patient request for pain medication.   OBJECTIVE: DERMATOLOGIC EXAMINATION: Nails: normal appearing nails bilaterally Skin Integrity: No abnormal findings.  VASCULAR EXAMINATION OF LOWER LIMBS: Pedal pulses: All pedal pulses are palpable with normal pulsation.  NEUROLOGIC EXAMINATION OF THE LOWER LIMBS: All epicritic and tactile sensations were grossly intact.  MUSCULOSKELETAL EXAMINATION: Short first digit bilateral. Elevated first metatarsal and forefoot varus with loading of forefoot. Normal forefoot and rearfoot joint motion bilateral.  ASSESSMENT: 1. 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 3. Osteoporosis. 4. Metatarsalgia bilateral.   Plan: Reviewed clinical findings and available treatment options including Orthotics custom or OTC, and pertinent information on surgical option that has no guarantee of success due to her existing neuropathic pain.  May need Cotton osteotomy with bone graft.

## 2015-08-31 NOTE — Patient Instructions (Signed)
Seen for pain in both feet. All options reviewed. Pain medication prescribed. Return as needed.

## 2016-05-17 ENCOUNTER — Emergency Department (HOSPITAL_BASED_OUTPATIENT_CLINIC_OR_DEPARTMENT_OTHER)
Admission: EM | Admit: 2016-05-17 | Discharge: 2016-05-17 | Disposition: A | Discharge status: Home / Self Care | Payer: Medicaid Other | Attending: Emergency Medicine | Admitting: Emergency Medicine

## 2016-05-17 ENCOUNTER — Encounter (HOSPITAL_BASED_OUTPATIENT_CLINIC_OR_DEPARTMENT_OTHER): Payer: Self-pay | Admitting: *Deleted

## 2016-05-17 ENCOUNTER — Emergency Department (HOSPITAL_BASED_OUTPATIENT_CLINIC_OR_DEPARTMENT_OTHER): Payer: Medicaid Other

## 2016-05-17 DIAGNOSIS — J45901 Unspecified asthma with (acute) exacerbation: Secondary | ICD-10-CM | POA: Diagnosis not present

## 2016-05-17 DIAGNOSIS — J42 Unspecified chronic bronchitis: Secondary | ICD-10-CM | POA: Diagnosis not present

## 2016-05-17 DIAGNOSIS — R05 Cough: Secondary | ICD-10-CM | POA: Diagnosis present

## 2016-05-17 DIAGNOSIS — F17200 Nicotine dependence, unspecified, uncomplicated: Secondary | ICD-10-CM | POA: Diagnosis not present

## 2016-05-17 HISTORY — DX: Polyneuropathy, unspecified: G62.9

## 2016-05-17 MED ORDER — ALBUTEROL SULFATE (2.5 MG/3ML) 0.083% IN NEBU
5.0000 mg | INHALATION_SOLUTION | Freq: Once | RESPIRATORY_TRACT | Status: AC
  Administered 2016-05-17: 5 mg via RESPIRATORY_TRACT
  Filled 2016-05-17: qty 6

## 2016-05-17 MED ORDER — PREDNISONE 50 MG PO TABS
60.0000 mg | ORAL_TABLET | Freq: Once | ORAL | Status: AC
  Administered 2016-05-17: 60 mg via ORAL
  Filled 2016-05-17: qty 1

## 2016-05-17 MED ORDER — IPRATROPIUM BROMIDE 0.02 % IN SOLN
0.5000 mg | Freq: Once | RESPIRATORY_TRACT | Status: AC
  Administered 2016-05-17: 0.5 mg via RESPIRATORY_TRACT
  Filled 2016-05-17: qty 2.5

## 2016-05-17 MED ORDER — PREDNISONE 10 MG PO TABS: 40.0000 mg | ORAL_TABLET | Freq: Every day | ORAL | 0 refills | Status: AC

## 2016-05-17 MED FILL — predniSONE 10 MG TABS: 10 | 5 days supply | Qty: 20 | Fill #0

## 2016-05-17 NOTE — ED Triage Notes (Signed)
Pt reports cough, congestion, sinus pain, and right rib pain when she coughs x 1 week.

## 2016-05-17 NOTE — ED Provider Notes (Signed)
[facility] Provider Note   CSN: [id number] Arrival date & time: 05/17/16  1117     History   Chief Complaint Chief Complaint  Patient presents with  . Cough    HPI Sabrina Cole is a 41 y.o. female with a history of asthma presenting with 1 week of coughing and cold symptoms that appeared to be somewhat resolving stating that she is now experiencing right-sided chest discomfort under her right breast. The pain is deep inside and pleuritic. Worse with coughing, laughing, and deep inhalation. She states that she is better when sitting in the chair and leaning back low bed. Worse when she lies down. She denies fever, chills, nausea, vomiting, shortness of breath, chest pain. She has not tried anything for the pain. She states that she is taking hydrocodone for neuropathic pain in her feet and that has not helped her.  HPI  Past Medical History:  Diagnosis Date  . Neuropathy [facility])     Patient Active Problem List   Diagnosis Date Noted  . Metatarsalgia of both feet 03/25/2015  . Metatarsal deformity 03/25/2015  . Peripheral neuropathy ([facility]) 03/25/2015    History reviewed. No pertinent surgical history.  OB History    No data available       Home Medications    Prior to Admission medications   Medication Sig Start Date End Date Taking? Authorizing Provider  amLODipine (NORVASC) 10 MG tablet Take 10 mg by mouth daily. 02/10/15   Historical Provider, MD  famotidine (PEPCID) 20 MG tablet Take 20 mg by mouth 2 (two) times daily. 01/08/15   Historical Provider, MD  gabapentin (NEURONTIN) 100 MG capsule Take 100 mg by mouth 3 (three) times daily. 01/08/15   Historical Provider, MD  hydrALAZINE (APRESOLINE) 50 MG tablet TAKE 1 TABLET BY MOUTH EVERY 8 HRS 01/30/15   Historical Provider, MD  HYDROcodone-acetaminophen (NORCO) 10-325 MG tablet Take 1 tablet by mouth every 8 (eight) hours as needed. 08/31/15   Myeong O Sheard, DPM  LYRICA 75 MG capsule Take 75 mg by mouth 2  (two) times daily. 02/23/15   Historical Provider, MD  metoprolol (LOPRESSOR) 50 MG tablet Take 50 mg by mouth. 12/05/14 12/05/15  Historical Provider, MD  nabumetone (RELAFEN) 500 MG tablet Take 1 tablet (500 mg total) by mouth 2 (two) times daily. 04/21/15   Myeong O Sheard, DPM  naproxen (NAPROSYN) 500 MG tablet Take 1 tablet (500 mg total) by mouth 2 (two) times daily with a meal. 04/22/15   Myeong O Sheard, DPM  nicotine (RA NICOTINE) 14 mg/24hr patch Place onto the skin. 12/05/14   Historical Provider, MD  predniSONE (DELTASONE) 10 MG tablet Take 4 tablets (40 mg total) by mouth daily. 05/17/16 05/22/16  Georgiana Shore, PA-C  PROAIR HFA 108 (90 BASE) MCG/ACT inhaler 1 (ONE) PUFF EVERY FOUR HOURS, AS NEEDED 01/19/15   Historical Provider, MD  risperiDONE (RISPERDAL) 2 MG tablet Take 2 mg by mouth. 12/05/14   Historical Provider, MD  sertraline (ZOLOFT) 50 MG tablet TAKE 1/2 TAB BY MOUTH DAILY FOR 2 WEEKS THEN 1 TAB DAILY 01/06/15   Historical Provider, MD    Family History No family history on file.  Social History Social History  Substance Use Topics  . Smoking status: Current Every Day Smoker  . Smokeless tobacco: Never Used  . Alcohol use Not on file     Allergies   Patient has no known allergies.   Review of Systems Review of Systems  Constitutional: Negative  for chills and fever.  HENT: Positive for congestion, postnasal drip and rhinorrhea. Negative for ear pain and sore throat.   Eyes: Negative for pain and visual disturbance.  Respiratory: Positive for cough. Negative for chest tightness, shortness of breath, wheezing and stridor.   Cardiovascular: Positive for chest pain. Negative for palpitations and leg swelling.       Focal lower right anterior pleuritic chest pain  Gastrointestinal: Positive for diarrhea. Negative for abdominal distention, abdominal pain, blood in stool, nausea and vomiting.  Genitourinary: Negative for dysuria and hematuria.  Musculoskeletal:  Negative for arthralgias, back pain, gait problem, myalgias, neck pain and neck stiffness.  Skin: Negative for color change, pallor and rash.  Neurological: Negative for dizziness, seizures, syncope, weakness, light-headedness, numbness and headaches.  All other systems reviewed and are negative.    Physical Exam Updated Vital Signs BP 145/99 (BP Location: Right Arm)   Pulse 85   Temp 98.4 F (36.9 C) (Oral)   Resp 22   LMP 05/17/2016   SpO2 100%   Physical Exam  Constitutional: She appears well-developed and well-nourished. No distress.  Afebrile, nontoxic-appearing, sitting comfortably in chair in no acute distress  HENT:  Head: Normocephalic and atraumatic.  Eyes: Conjunctivae and EOM are normal. Right eye exhibits no discharge. Left eye exhibits no discharge.  Neck: Normal range of motion. Neck supple.  Cardiovascular: Normal rate, regular rhythm and normal heart sounds.   No murmur heard. Pulmonary/Chest: Effort normal. No respiratory distress. She has wheezes. She exhibits no tenderness.  Patient with bilateral wheezing.  Abdominal: Soft. She exhibits no distension. There is no tenderness.  Musculoskeletal: She exhibits no edema.  Neurological: She is alert.  Skin: Skin is warm and dry. She is not diaphoretic. No pallor.  Psychiatric: She has a normal mood and affect. Her behavior is normal.  Nursing note and vitals reviewed.    ED Treatments / Results  Labs (all labs ordered are listed, but only abnormal results are displayed) Labs Reviewed - No data to display  EKG  EKG Interpretation None       Radiology Dg Chest 2 View  Result Date: 05/17/2016 CLINICAL DATA:  Cough, congestion, sinus pain, and RIGHT rib pain with coughing for 1 week, history smoking EXAM: CHEST  2 VIEW COMPARISON:  12/01/2014 FINDINGS: Normal heart size, mediastinal contours, and pulmonary vascularity. Chronic elevation of LEFT diaphragm. Central peribronchial thickening without  infiltrate, pleural effusion or pneumothorax. No acute osseous findings. LEFT cervical rib noted. IMPRESSION: Bronchitic changes without infiltrate. Electronically Signed   By: Ulyses Southward M.D.   On: 05/17/2016 15:51    Procedures Procedures (including critical care time)  Medications Ordered in ED Medications  predniSONE (DELTASONE) tablet 60 mg (not administered)  albuterol (PROVENTIL) (2.5 MG/3ML) 0.083% nebulizer solution 5 mg (5 mg Nebulization Given 05/17/16 1605)  ipratropium (ATROVENT) nebulizer solution 0.5 mg (0.5 mg Nebulization Given 05/17/16 1605)     Initial Impression / Assessment and Plan / ED Course  I have reviewed the triage vital signs and the nursing notes.  Pertinent labs & imaging results that were available during my care of the patient were reviewed by me and considered in my medical decision making (see chart for details).       41 y/o female presenting with sudden onset of right anterior lower chest discomfort in inframammary crease. No rash noted on exam. No tenderness to palpation. She states that it is worse with deep inhalation and coughing. Wheezing bilaterally on exam. Given nebs  Chest x-ray with bronchitic changes without infiltrates. No pneumonia.  Reassessed and she was feeling well. No longer wheezing on exam. Patient was better and ready to go home reassured that she did not have evidence of pneumonia on xray as her main concern was pneumonia.  Patient reported last asthma exacerbation requiring prednisone 1 year ago. She was hospitalized as a child for asthma. Never intubated. She has an inhaler at home. She reports a hx of chronic bronchitis and asthma. She had a depo shot 2 months ago and denies any possibility that she can be pregnant.  Discharge home with prednisone burst and close follow up with PCP. Patient advised to use her inhaler as needed and verified that she was educated on its proper use.  Discussed strict return precautions.  Patient was advised to return to the emergency department if experiencing any new or worsening of symptoms. She understood instructions and agreed with discharge plan.  Final Clinical Impressions(s) / ED Diagnoses   Final diagnoses:  Bronchitis    New Prescriptions New Prescriptions   PREDNISONE (DELTASONE) 10 MG TABLET    Take 4 tablets (40 mg total) by mouth daily.     Georgiana ShoreJessica B Mitchell, PA-C 05/18/16 2000    Jerelyn ScottMartha Linker, MD 05/23/16 [phone]

## 2017-07-06 IMAGING — DX DG CHEST 2V
2 series · 2 of 2 positions shown · non-contrast
Comparison: 12/01/2014

CLINICAL DATA: Cough, congestion, sinus pain, and RIGHT rib pain
with coughing for 1 week, history smoking

EXAM:
CHEST  2 VIEW

[chest pa]
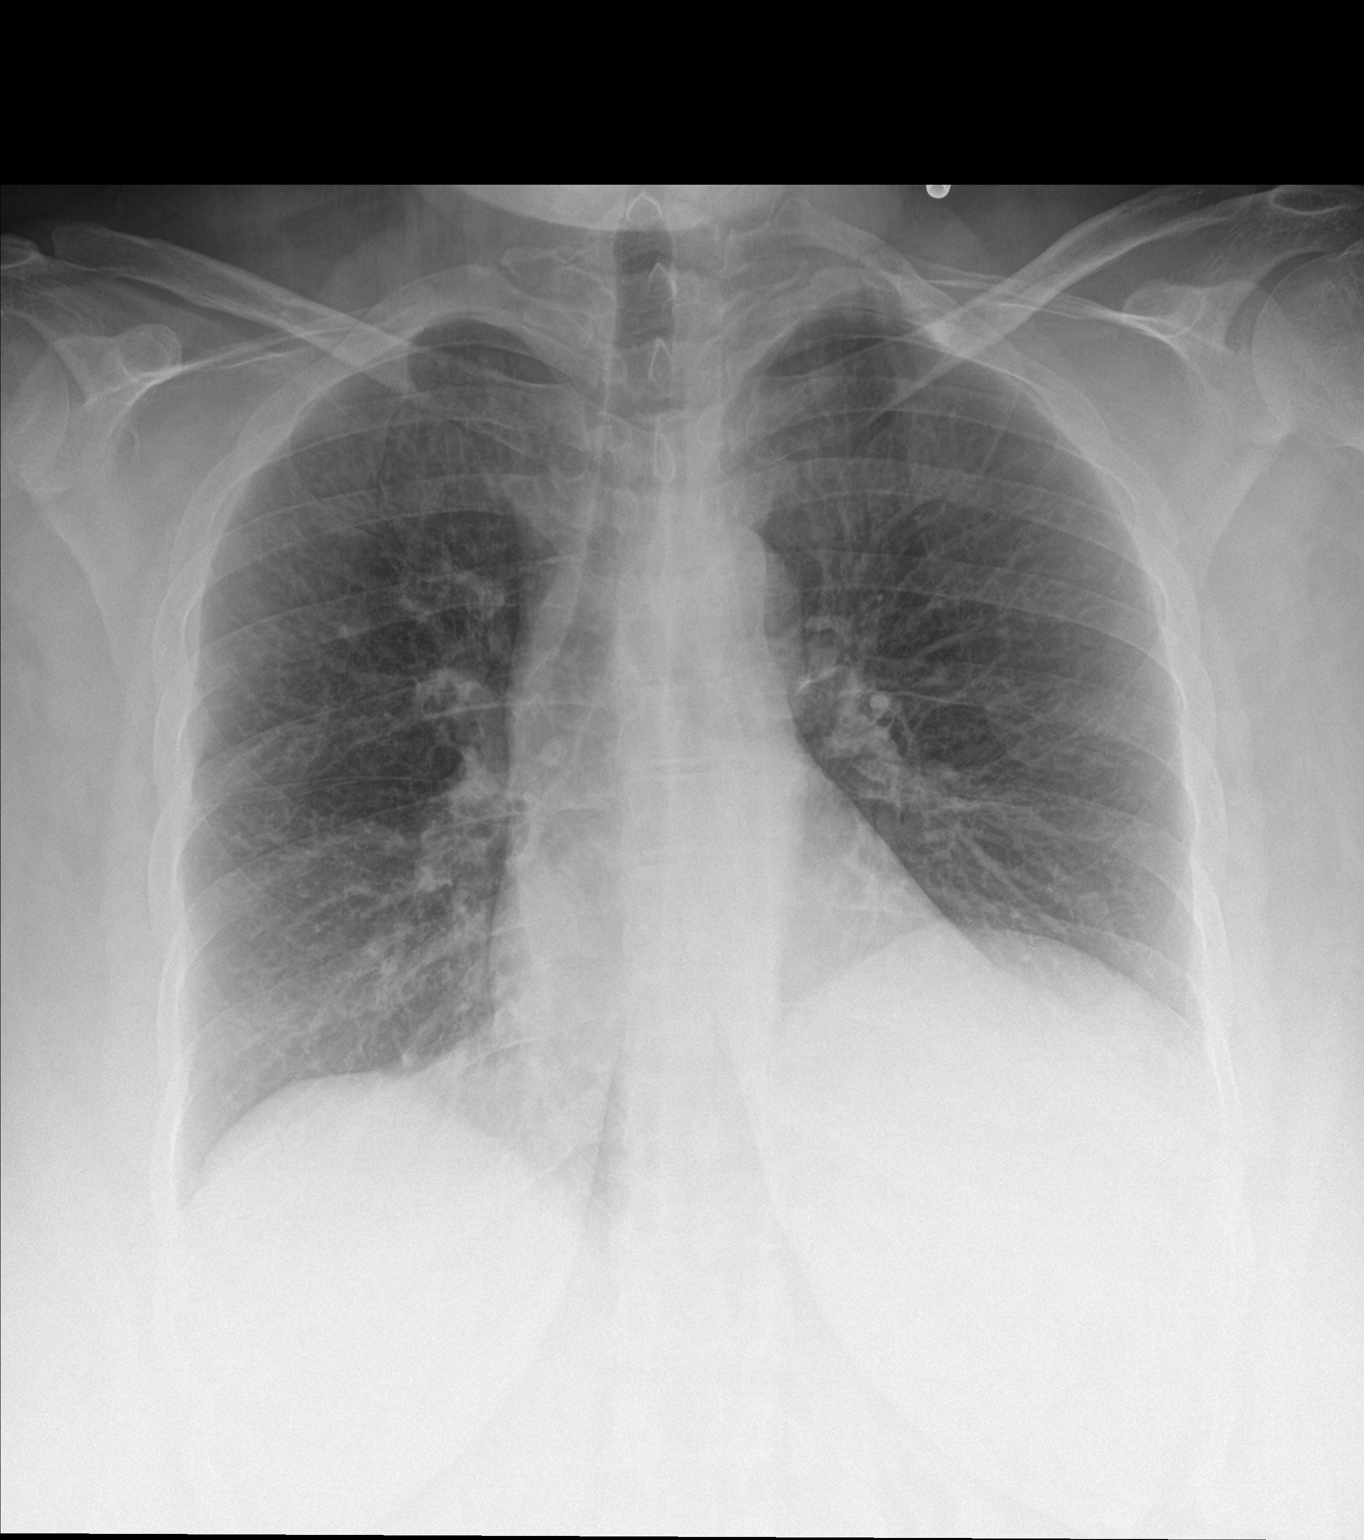

[chest lat]
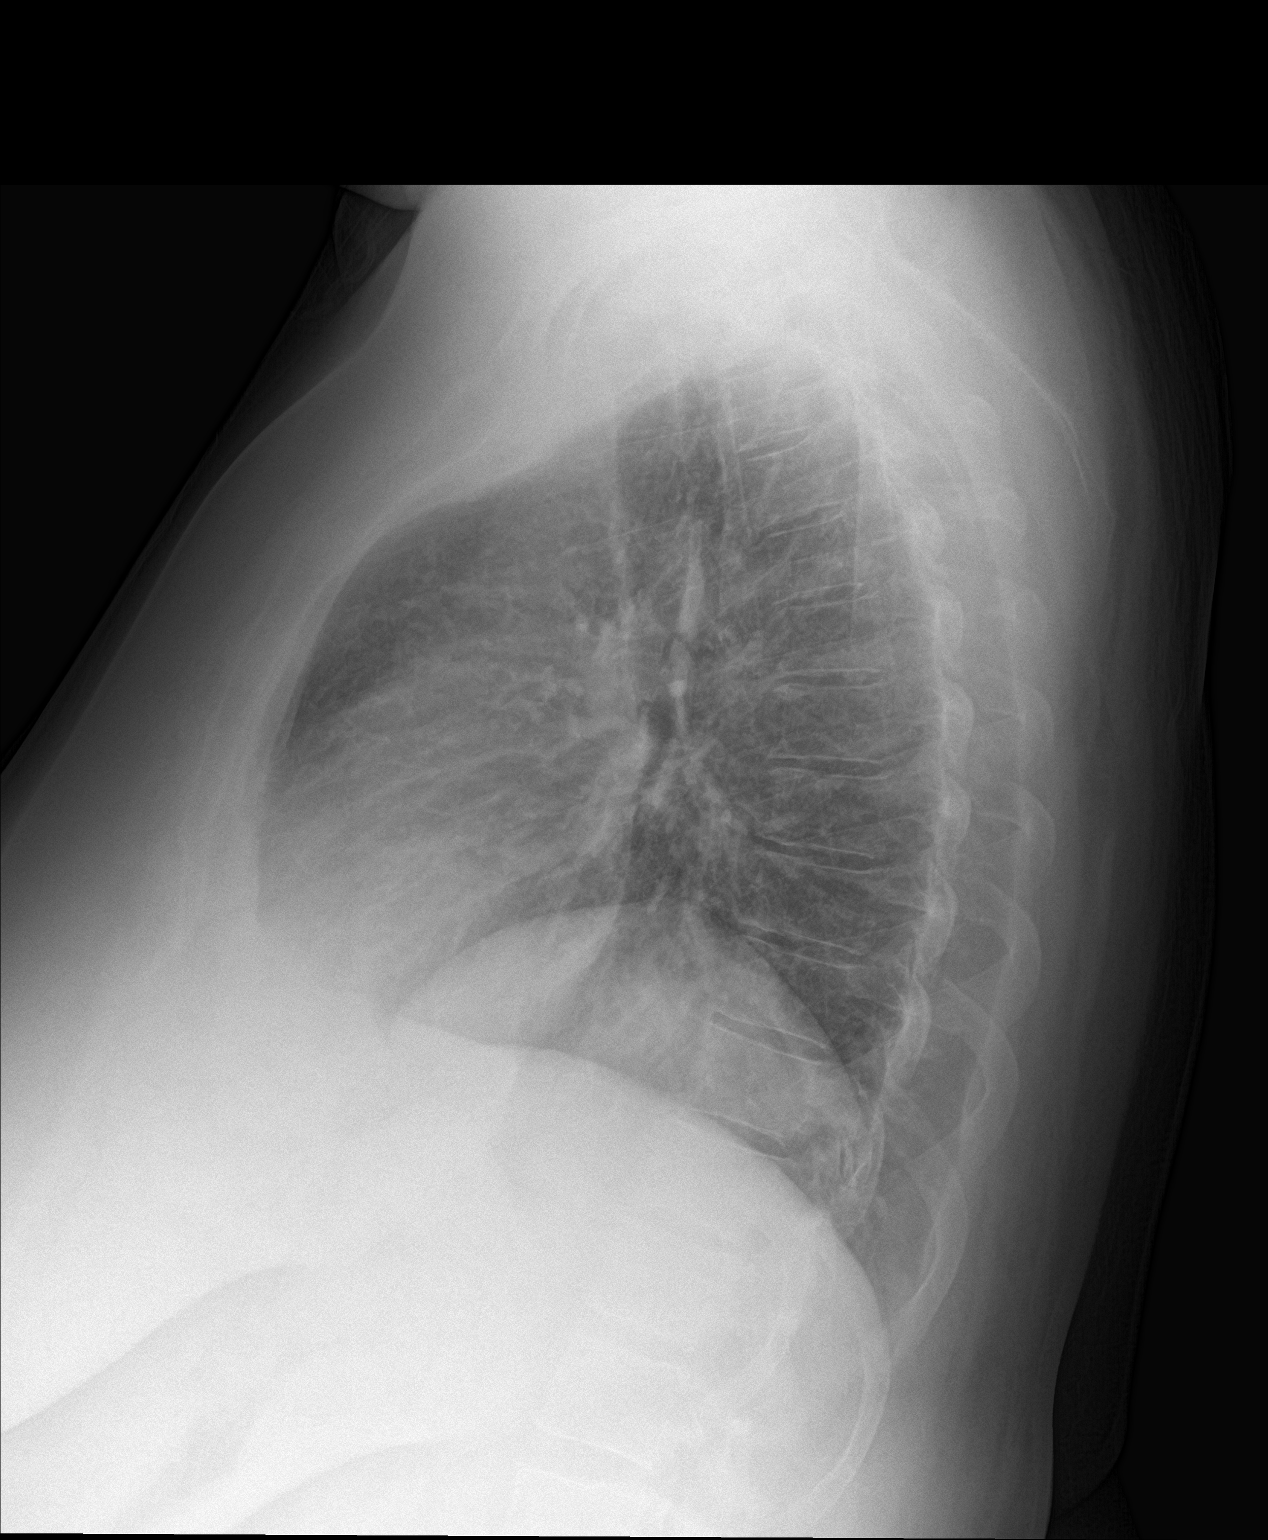

[2 of 2 positions shown; findings below may reference images not displayed]

FINDINGS: Normal heart size, mediastinal contours, and pulmonary vascularity.

Chronic elevation of LEFT diaphragm.

Central peribronchial thickening without infiltrate, pleural
effusion or pneumothorax.

No acute osseous findings.

LEFT cervical rib noted.
IMPRESSION: Bronchitic changes without infiltrate.
